# Patient Record
Sex: Male | Born: 1935 | Race: Black or African American | Hispanic: No | Marital: Married | Smoking: Current every day smoker
Health system: Southern US, Community
[De-identification: ages and names within clinical notes are randomized; demographics above are authoritative.]

## PROBLEM LIST (undated history)

## (undated) DIAGNOSIS — E785 Hyperlipidemia, unspecified: Secondary | ICD-10-CM

## (undated) DIAGNOSIS — D649 Anemia, unspecified: Secondary | ICD-10-CM

## (undated) HISTORY — PX: NO PAST SURGERIES: SHX2092

## (undated) HISTORY — DX: Hyperlipidemia, unspecified: E78.5

## (undated) HISTORY — DX: Anemia, unspecified: D64.9

---

## 2000-02-13 ENCOUNTER — Inpatient Hospital Stay (HOSPITAL_COMMUNITY): Admission: EM | Admit: 2000-02-13 | Discharge: 2000-02-18 | Payer: Self-pay | Admitting: Emergency Medicine

## 2000-02-13 ENCOUNTER — Encounter: Payer: Self-pay | Admitting: *Deleted

## 2000-02-25 ENCOUNTER — Encounter: Admission: RE | Admit: 2000-02-25 | Discharge: 2000-02-25 | Payer: Self-pay | Admitting: Internal Medicine

## 2000-02-27 ENCOUNTER — Encounter: Admission: RE | Admit: 2000-02-27 | Discharge: 2000-05-27 | Payer: Self-pay | Admitting: *Deleted

## 2000-02-28 ENCOUNTER — Encounter: Admission: RE | Admit: 2000-02-28 | Discharge: 2000-02-28 | Payer: Self-pay | Admitting: Internal Medicine

## 2000-02-28 ENCOUNTER — Ambulatory Visit (HOSPITAL_COMMUNITY): Admission: RE | Admit: 2000-02-28 | Discharge: 2000-02-28 | Payer: Self-pay | Admitting: *Deleted

## 2000-03-03 ENCOUNTER — Encounter: Admission: RE | Admit: 2000-03-03 | Discharge: 2000-03-03 | Payer: Self-pay | Admitting: Internal Medicine

## 2000-03-05 ENCOUNTER — Ambulatory Visit (HOSPITAL_COMMUNITY): Admission: RE | Admit: 2000-03-05 | Discharge: 2000-03-05 | Payer: Self-pay | Admitting: *Deleted

## 2000-03-07 ENCOUNTER — Encounter: Admission: RE | Admit: 2000-03-07 | Discharge: 2000-03-07 | Payer: Self-pay | Admitting: Internal Medicine

## 2000-03-17 ENCOUNTER — Encounter: Admission: RE | Admit: 2000-03-17 | Discharge: 2000-03-17 | Payer: Self-pay | Admitting: Internal Medicine

## 2000-05-26 ENCOUNTER — Encounter: Admission: RE | Admit: 2000-05-26 | Discharge: 2000-05-26 | Payer: Self-pay | Admitting: Internal Medicine

## 2000-08-25 ENCOUNTER — Encounter: Admission: RE | Admit: 2000-08-25 | Discharge: 2000-08-25 | Payer: Self-pay | Admitting: Internal Medicine

## 2000-11-19 ENCOUNTER — Encounter: Admission: RE | Admit: 2000-11-19 | Discharge: 2000-11-19 | Payer: Self-pay | Admitting: Internal Medicine

## 2001-02-09 ENCOUNTER — Encounter: Admission: RE | Admit: 2001-02-09 | Discharge: 2001-02-09 | Payer: Self-pay

## 2001-03-04 ENCOUNTER — Encounter: Admission: RE | Admit: 2001-03-04 | Discharge: 2001-03-04 | Payer: Self-pay | Admitting: Internal Medicine

## 2001-04-10 ENCOUNTER — Encounter: Admission: RE | Admit: 2001-04-10 | Discharge: 2001-04-10 | Payer: Self-pay | Admitting: Internal Medicine

## 2001-06-08 ENCOUNTER — Encounter: Admission: RE | Admit: 2001-06-08 | Discharge: 2001-06-08 | Payer: Self-pay

## 2001-07-29 ENCOUNTER — Encounter (HOSPITAL_BASED_OUTPATIENT_CLINIC_OR_DEPARTMENT_OTHER): Admission: RE | Admit: 2001-07-29 | Discharge: 2001-08-07 | Payer: Self-pay | Admitting: Internal Medicine

## 2001-09-07 ENCOUNTER — Encounter: Admission: RE | Admit: 2001-09-07 | Discharge: 2001-09-07 | Payer: Self-pay | Admitting: Internal Medicine

## 2001-11-02 ENCOUNTER — Encounter (HOSPITAL_BASED_OUTPATIENT_CLINIC_OR_DEPARTMENT_OTHER): Admission: RE | Admit: 2001-11-02 | Discharge: 2001-11-20 | Payer: Self-pay | Admitting: Internal Medicine

## 2001-12-31 ENCOUNTER — Encounter: Admission: RE | Admit: 2001-12-31 | Discharge: 2001-12-31 | Payer: Self-pay | Admitting: Internal Medicine

## 2002-02-11 ENCOUNTER — Encounter (HOSPITAL_BASED_OUTPATIENT_CLINIC_OR_DEPARTMENT_OTHER): Admission: RE | Admit: 2002-02-11 | Discharge: 2002-05-09 | Payer: Self-pay | Admitting: Internal Medicine

## 2002-04-30 ENCOUNTER — Encounter: Admission: RE | Admit: 2002-04-30 | Discharge: 2002-04-30 | Payer: Self-pay | Admitting: Internal Medicine

## 2002-05-17 ENCOUNTER — Encounter (HOSPITAL_BASED_OUTPATIENT_CLINIC_OR_DEPARTMENT_OTHER): Admission: RE | Admit: 2002-05-17 | Discharge: 2002-08-15 | Payer: Self-pay | Admitting: Internal Medicine

## 2002-07-07 ENCOUNTER — Encounter: Admission: RE | Admit: 2002-07-07 | Discharge: 2002-07-07 | Payer: Self-pay | Admitting: Internal Medicine

## 2002-08-24 ENCOUNTER — Encounter (HOSPITAL_BASED_OUTPATIENT_CLINIC_OR_DEPARTMENT_OTHER): Admission: RE | Admit: 2002-08-24 | Discharge: 2002-10-06 | Payer: Self-pay | Admitting: Internal Medicine

## 2002-10-13 ENCOUNTER — Encounter: Admission: RE | Admit: 2002-10-13 | Discharge: 2002-10-13 | Payer: Self-pay | Admitting: Internal Medicine

## 2002-11-23 ENCOUNTER — Encounter: Admission: RE | Admit: 2002-11-23 | Discharge: 2002-11-23 | Payer: Self-pay | Admitting: Internal Medicine

## 2002-12-31 ENCOUNTER — Encounter (HOSPITAL_BASED_OUTPATIENT_CLINIC_OR_DEPARTMENT_OTHER): Admission: RE | Admit: 2002-12-31 | Discharge: 2003-01-18 | Payer: Self-pay | Admitting: Internal Medicine

## 2003-02-07 ENCOUNTER — Encounter: Admission: RE | Admit: 2003-02-07 | Discharge: 2003-02-07 | Payer: Self-pay | Admitting: Internal Medicine

## 2003-02-10 ENCOUNTER — Encounter: Admission: RE | Admit: 2003-02-10 | Discharge: 2003-02-10 | Payer: Self-pay | Admitting: Internal Medicine

## 2003-04-21 ENCOUNTER — Encounter (HOSPITAL_BASED_OUTPATIENT_CLINIC_OR_DEPARTMENT_OTHER): Admission: RE | Admit: 2003-04-21 | Discharge: 2003-05-26 | Payer: Self-pay | Admitting: Internal Medicine

## 2003-05-19 ENCOUNTER — Encounter: Admission: RE | Admit: 2003-05-19 | Discharge: 2003-05-19 | Payer: Self-pay | Admitting: Internal Medicine

## 2003-09-07 ENCOUNTER — Encounter (HOSPITAL_BASED_OUTPATIENT_CLINIC_OR_DEPARTMENT_OTHER): Admission: RE | Admit: 2003-09-07 | Discharge: 2003-09-22 | Payer: Self-pay | Admitting: Internal Medicine

## 2003-09-16 ENCOUNTER — Encounter: Admission: RE | Admit: 2003-09-16 | Discharge: 2003-09-16 | Payer: Self-pay | Admitting: Internal Medicine

## 2003-12-13 ENCOUNTER — Encounter (HOSPITAL_BASED_OUTPATIENT_CLINIC_OR_DEPARTMENT_OTHER): Admission: RE | Admit: 2003-12-13 | Discharge: 2003-12-27 | Payer: Self-pay | Admitting: Internal Medicine

## 2004-02-07 ENCOUNTER — Ambulatory Visit: Payer: Self-pay | Admitting: Internal Medicine

## 2004-03-27 ENCOUNTER — Encounter (HOSPITAL_BASED_OUTPATIENT_CLINIC_OR_DEPARTMENT_OTHER): Admission: RE | Admit: 2004-03-27 | Discharge: 2004-04-24 | Payer: Self-pay | Admitting: Internal Medicine

## 2004-05-09 ENCOUNTER — Ambulatory Visit: Payer: Self-pay | Admitting: Internal Medicine

## 2004-05-30 ENCOUNTER — Ambulatory Visit: Payer: Self-pay | Admitting: Internal Medicine

## 2004-07-18 ENCOUNTER — Ambulatory Visit: Payer: Self-pay | Admitting: Internal Medicine

## 2004-10-25 ENCOUNTER — Ambulatory Visit: Payer: Self-pay | Admitting: Internal Medicine

## 2005-01-15 ENCOUNTER — Ambulatory Visit: Payer: Self-pay | Admitting: Internal Medicine

## 2005-03-14 ENCOUNTER — Ambulatory Visit: Payer: Self-pay | Admitting: Internal Medicine

## 2005-07-24 ENCOUNTER — Ambulatory Visit: Payer: Self-pay | Admitting: Internal Medicine

## 2005-12-30 ENCOUNTER — Ambulatory Visit: Payer: Self-pay | Admitting: Hospitalist

## 2006-01-14 ENCOUNTER — Encounter (INDEPENDENT_AMBULATORY_CARE_PROVIDER_SITE_OTHER): Payer: Self-pay | Admitting: Internal Medicine

## 2006-01-14 ENCOUNTER — Ambulatory Visit: Payer: Self-pay | Admitting: Internal Medicine

## 2006-01-14 LAB — CONVERTED CEMR LAB
Cholesterol: 183 mg/dL (ref 0–200)
HDL: 79 mg/dL (ref >39)
Total CHOL/HDL Ratio: 2.3

## 2006-04-28 ENCOUNTER — Ambulatory Visit: Payer: Self-pay | Admitting: Hospitalist

## 2006-04-28 LAB — CONVERTED CEMR LAB
BUN: 15 mg/dL (ref 6–23)
CO2: 28 meq/L (ref 19–32)
Calcium: 9.6 mg/dL (ref 8.4–10.5)
Glucose, Bld: 85 mg/dL (ref 70–99)
Potassium: 3.5 meq/L (ref 3.5–5.3)

## 2006-04-30 ENCOUNTER — Telehealth: Payer: Self-pay | Admitting: *Deleted

## 2006-05-05 DIAGNOSIS — D509 Iron deficiency anemia, unspecified: Secondary | ICD-10-CM | POA: Insufficient documentation

## 2006-12-10 ENCOUNTER — Ambulatory Visit: Payer: Self-pay | Admitting: Internal Medicine

## 2006-12-10 ENCOUNTER — Encounter (INDEPENDENT_AMBULATORY_CARE_PROVIDER_SITE_OTHER): Payer: Self-pay | Admitting: Internal Medicine

## 2006-12-10 LAB — CONVERTED CEMR LAB: Hgb A1c MFr Bld: 5.1 %

## 2006-12-11 LAB — CONVERTED CEMR LAB
AST: 42 units/L — ABNORMAL HIGH (ref 0–37)
Albumin: 4.6 g/dL (ref 3.5–5.2)
BUN: 11 mg/dL (ref 6–23)
Calcium: 9.4 mg/dL (ref 8.4–10.5)
Chloride: 104 meq/L (ref 96–112)
Cholesterol: 234 mg/dL — ABNORMAL HIGH (ref 0–200)
Creatinine, Ser: 0.9 mg/dL (ref 0.40–1.50)
Glucose, Bld: 118 mg/dL — ABNORMAL HIGH (ref 70–99)
HCT: 38.7 % — ABNORMAL LOW (ref 39.0–52.0)
HDL: 84 mg/dL (ref >39)
Hemoglobin: 13 g/dL (ref 13.0–17.0)
Microalb Creat Ratio: 5.9 mg/g (ref 0.0–30.0)
RBC: 5.33 M/uL (ref 4.22–5.81)
RDW: 17.4 % — ABNORMAL HIGH (ref 11.5–14.0)
Total CHOL/HDL Ratio: 2.8
Triglycerides: 106 mg/dL (ref <150)

## 2006-12-15 ENCOUNTER — Telehealth: Payer: Self-pay | Admitting: *Deleted

## 2006-12-15 DIAGNOSIS — R945 Abnormal results of liver function studies: Secondary | ICD-10-CM

## 2006-12-18 ENCOUNTER — Ambulatory Visit: Payer: Self-pay | Admitting: Internal Medicine

## 2006-12-18 ENCOUNTER — Encounter (INDEPENDENT_AMBULATORY_CARE_PROVIDER_SITE_OTHER): Payer: Self-pay | Admitting: Internal Medicine

## 2006-12-18 LAB — FECAL OCCULT BLOOD, GUAIAC: Fecal Occult Blood: NEGATIVE

## 2007-02-09 ENCOUNTER — Encounter (INDEPENDENT_AMBULATORY_CARE_PROVIDER_SITE_OTHER): Payer: Self-pay | Admitting: *Deleted

## 2007-02-09 ENCOUNTER — Ambulatory Visit: Payer: Self-pay | Admitting: Internal Medicine

## 2007-02-09 LAB — CONVERTED CEMR LAB: Blood Glucose, Fingerstick: 50

## 2007-02-12 ENCOUNTER — Encounter (INDEPENDENT_AMBULATORY_CARE_PROVIDER_SITE_OTHER): Payer: Self-pay | Admitting: Internal Medicine

## 2007-04-22 ENCOUNTER — Telehealth (INDEPENDENT_AMBULATORY_CARE_PROVIDER_SITE_OTHER): Payer: Self-pay | Admitting: Internal Medicine

## 2007-05-01 ENCOUNTER — Encounter (INDEPENDENT_AMBULATORY_CARE_PROVIDER_SITE_OTHER): Payer: Self-pay | Admitting: Internal Medicine

## 2007-06-03 ENCOUNTER — Telehealth: Payer: Self-pay | Admitting: *Deleted

## 2007-06-17 ENCOUNTER — Ambulatory Visit: Payer: Self-pay | Admitting: Internal Medicine

## 2007-06-18 ENCOUNTER — Encounter (INDEPENDENT_AMBULATORY_CARE_PROVIDER_SITE_OTHER): Payer: Self-pay | Admitting: Internal Medicine

## 2007-07-06 ENCOUNTER — Telehealth (INDEPENDENT_AMBULATORY_CARE_PROVIDER_SITE_OTHER): Payer: Self-pay | Admitting: Internal Medicine

## 2007-07-08 ENCOUNTER — Encounter (INDEPENDENT_AMBULATORY_CARE_PROVIDER_SITE_OTHER): Payer: Self-pay | Admitting: Internal Medicine

## 2007-08-28 ENCOUNTER — Encounter (INDEPENDENT_AMBULATORY_CARE_PROVIDER_SITE_OTHER): Payer: Self-pay | Admitting: Internal Medicine

## 2008-01-13 ENCOUNTER — Ambulatory Visit: Payer: Self-pay | Admitting: Infectious Disease

## 2008-02-22 ENCOUNTER — Encounter (INDEPENDENT_AMBULATORY_CARE_PROVIDER_SITE_OTHER): Payer: Self-pay | Admitting: *Deleted

## 2008-05-09 ENCOUNTER — Telehealth: Payer: Self-pay | Admitting: *Deleted

## 2008-05-11 ENCOUNTER — Telehealth (INDEPENDENT_AMBULATORY_CARE_PROVIDER_SITE_OTHER): Payer: Self-pay | Admitting: *Deleted

## 2008-05-12 ENCOUNTER — Ambulatory Visit: Payer: Self-pay | Admitting: Internal Medicine

## 2008-05-12 ENCOUNTER — Encounter: Payer: Self-pay | Admitting: Internal Medicine

## 2008-05-13 ENCOUNTER — Encounter (INDEPENDENT_AMBULATORY_CARE_PROVIDER_SITE_OTHER): Payer: Self-pay | Admitting: *Deleted

## 2008-05-13 LAB — CONVERTED CEMR LAB
Albumin: 4.6 g/dL (ref 3.5–5.2)
Alkaline Phosphatase: 39 units/L (ref 39–117)
BUN: 10 mg/dL (ref 6–23)
Calcium: 10.5 mg/dL (ref 8.4–10.5)
Chloride: 101 meq/L (ref 96–112)
Creatinine, Urine: 29.8 mg/dL
Ferritin: 215 ng/mL (ref 22–322)
Glucose, Bld: 70 mg/dL (ref 70–99)
HDL: 96 mg/dL (ref >39)
Hemoglobin: 14.9 g/dL (ref 13.0–17.0)
LDL Cholesterol: 97 mg/dL (ref 0–99)
Microalb Creat Ratio: 16.8 mg/g (ref 0.0–30.0)
Microalb, Ur: 0.5 mg/dL (ref 0.00–1.89)
Potassium: 3.7 meq/L (ref 3.5–5.3)
RBC: 6.12 M/uL — ABNORMAL HIGH (ref 4.22–5.81)
Sodium: 141 meq/L (ref 135–145)
Total Protein: 8.6 g/dL — ABNORMAL HIGH (ref 6.0–8.3)
Triglycerides: 74 mg/dL (ref <150)
WBC: 6.7 10*3/uL (ref 4.0–10.5)

## 2008-06-25 ENCOUNTER — Encounter (INDEPENDENT_AMBULATORY_CARE_PROVIDER_SITE_OTHER): Payer: Self-pay | Admitting: *Deleted

## 2008-07-04 ENCOUNTER — Encounter (INDEPENDENT_AMBULATORY_CARE_PROVIDER_SITE_OTHER): Payer: Self-pay | Admitting: *Deleted

## 2008-10-26 ENCOUNTER — Telehealth: Payer: Self-pay | Admitting: Internal Medicine

## 2008-12-12 ENCOUNTER — Encounter: Payer: Self-pay | Admitting: Internal Medicine

## 2009-06-25 ENCOUNTER — Ambulatory Visit: Payer: Self-pay | Admitting: Internal Medicine

## 2009-06-25 ENCOUNTER — Inpatient Hospital Stay (HOSPITAL_COMMUNITY): Admission: EM | Admit: 2009-06-25 | Discharge: 2009-06-28 | Payer: Self-pay | Admitting: Emergency Medicine

## 2009-06-25 ENCOUNTER — Ambulatory Visit: Payer: Self-pay | Admitting: Cardiology

## 2009-06-25 ENCOUNTER — Encounter: Payer: Self-pay | Admitting: *Deleted

## 2009-06-25 ENCOUNTER — Encounter: Payer: Self-pay | Admitting: Internal Medicine

## 2009-06-27 ENCOUNTER — Encounter: Payer: Self-pay | Admitting: Internal Medicine

## 2009-06-28 ENCOUNTER — Encounter: Payer: Self-pay | Admitting: Cardiovascular Disease

## 2009-06-28 ENCOUNTER — Encounter: Payer: Self-pay | Admitting: Internal Medicine

## 2009-07-04 ENCOUNTER — Encounter: Payer: Self-pay | Admitting: *Deleted

## 2009-07-05 ENCOUNTER — Encounter: Payer: Self-pay | Admitting: Internal Medicine

## 2009-07-05 ENCOUNTER — Ambulatory Visit: Payer: Self-pay | Admitting: Internal Medicine

## 2009-07-05 LAB — HM DIABETES FOOT EXAM: HM Diabetic Foot Exam: NEGATIVE

## 2009-07-05 LAB — CONVERTED CEMR LAB
Creatinine, Urine: 238.6 mg/dL
Magnesium: 1.8 mg/dL (ref 1.5–2.5)
Microalb, Ur: 1.73 mg/dL (ref 0.00–1.89)

## 2009-07-07 ENCOUNTER — Encounter: Payer: Self-pay | Admitting: Internal Medicine

## 2009-10-30 ENCOUNTER — Encounter: Payer: Self-pay | Admitting: Internal Medicine

## 2010-04-23 ENCOUNTER — Encounter: Payer: Self-pay | Admitting: Internal Medicine

## 2010-05-01 NOTE — Miscellaneous (Signed)
Summary: [facility] CERTIFICATION AND PLAN OF CARE  [facility] CERTIF[facility]AN [facility] CARE   Imported By: Shon Hough 11/08/2009 10:21:54  _____________________________________________________________________  External Attachment:    Type:   Image     Comment:   External Document

## 2010-05-01 NOTE — Miscellaneous (Signed)
Summary: [facility]  [facility]   Imported By: Margie Billet 07/14/2009 14:49:55  _____________________________________________________________________  External Attachment:    Type:   Image     Comment:   External Document

## 2010-05-01 NOTE — Assessment & Plan Note (Signed)
Summary: EST-HFU/CFB   Vital Signs:  Patient profile:   77 year old male Height:      75 inches (190.50 cm) Temp:     98.8 degrees F (37.11 degrees C) oral Pulse rate:   95 / minute BP sitting:   108 / 72  (right arm)  Vitals Entered By: Stanton Kidney Ditzler RN (July 05, 2009 2:50 PM) Is Patient Diabetic? Yes Did you bring your meter with you today? No Pain Assessment Patient in pain? no      Nutritional Status Detail appetite good CBG Result 111  Have you ever been in a relationship where you felt threatened, hurt or afraid?denies   Does patient need assistance? Functional Status Self care Ambulation Impaired:Risk for fall Comments Uses a cane. Friend with pt - lives not far from pt. HFU - pt states doing well.   Primary Care Provider:  Peggye Pitt MD   History of Present Illness: 77 yo male with DM2 well controlled (A1C 5.6), HTN, HLD, and recent hospitalization for seizure secondary to hypoglycemia (admitted with CBG 18), history of DKA, alcohol and tobacco abuse presents to [facility] for regular follow up appointment. He has no concerns at the time and reports feeling at his baseline. He denies any recent sicknesses besides the recent hospitalization, no episodes of chest pain, no fever/chills, no abdominal or urinary concerns. Still smoking and drinking occasionaly but reports he cut down. No recent history of weight gain or loss, no changes in appetite or mood.        Preventive Screening-Counseling & Management  Alcohol-Tobacco     Alcohol type: seldom     Smoking Status: current     Smoking Cessation Counseling: yes     Packs/Day: 1/2 ppd     Year Started: 2-3 CIGARETTES A WEEK  Caffeine-Diet-Exercise     Does Patient Exercise: yes     Type of exercise: WALKING     Times/week: 1-2  Problems Prior to Update: 1)  Preventive Health Care  (ICD-V70.0) 2)  Liver Function Tests, Abnormal  (ICD-794.8) 3)  Health Maintenance Exam  (ICD-V70.0) 4)  Dm,  Uncomplicated, Type II  (ICD-250.00) 5)  Anemia  (ICD-285.9) 6)  Tobacco Abuse  (ICD-305.1) 7)  Hypertension  (ICD-401.9) 8)  Hyperlipidemia  (ICD-272.4)  Medications Prior to Update: 1)  Lotensin Hct 20-25 Mg Tabs (Benazepril-Hydrochlorothiazide) .... Take 1 Tablet By Mouth Once A Day 2)  Zocor 20 Mg  Tabs (Simvastatin) .... Take 1 Tab By Mouth At Bedtime 3)  Metformin Hcl 500 Mg Tabs (Metformin Hcl) .... Take 1 Tablet By Mouth Two Times A Day For A Week. The Increase It To Take 2 Tablet By Mouth Two Times A Day 4)  Vitamin B-1 100 Mg Tabs (Thiamine Hcl) .... Take 1 Tablet By Mouth Once A Day 5)  Folic Acid 1 Mg Tabs (Folic Acid) .... Take 1 Tablet By Mouth Once A Day  Current Medications (verified): 1)  Lotensin Hct 20-25 Mg Tabs (Benazepril-Hydrochlorothiazide) .... Take 1 Tablet By Mouth Once A Day 2)  Zocor 20 Mg  Tabs (Simvastatin) .... Take 1 Tab By Mouth At Bedtime 3)  Metformin Hcl 500 Mg Tabs (Metformin Hcl) .... Take 1 Tablet By Mouth Two Times A Day For A Week. The Increase It To Take 2 Tablet By Mouth Two Times A Day 4)  Vitamin B-1 100 Mg Tabs (Thiamine Hcl) .... Take 1 Tablet By Mouth Once A Day 5)  Folic Acid 1 Mg Tabs (Folic Acid) .Marland KitchenMarland KitchenMarland Kitchen  Take 1 Tablet By Mouth Once A Day  Allergies (verified): No Known Drug Allergies  Past History:  Past Medical History: Last updated: 05/05/2006 Hyperlipidemia- noraml lipids 4/04 Hypertension tobacco abuse Microcytic anemia Diabetes mellitus type II  Risk Factors: Exercise: yes (07/05/2009)  Risk Factors: Smoking Status: current (07/05/2009) Packs/Day: 1/2 ppd (07/05/2009)  Social History: Packs/Day:  1/2 ppd  Review of Systems       per HPI  Physical Exam  General:  Well-developed,well-nourished,in no acute distress; alert,appropriate and cooperative throughout examination Lungs:  Normal respiratory effort, chest expands symmetrically. Lungs are clear to auscultation, no crackles or wheezes. Heart:  Normal rate and  regular rhythm. S1 and S2 normal without gallop, murmur, click, rub or other extra sounds. Abdomen:  Bowel sounds positive,abdomen soft and non-tender without masses, organomegaly or hernias noted.  Diabetes Management Exam:    Foot Exam (with socks and/or shoes not present):       Sensory-Pinprick/Light touch:          Left medial foot (L-4): normal          Left dorsal foot (L-5): normal          Left lateral foot (S-1): normal          Right medial foot (L-4): normal          Right dorsal foot (L-5): normal          Right lateral foot (S-1): normal       Sensory-Monofilament:          Left foot: normal          Right foot: normal       Inspection:          Left foot: normal          Right foot: normal       Nails:          Left foot: too long          Right foot: too long   Impression & Recommendations:  Problem # 1:  DM, UNCOMPLICATED, TYPE II (ICD-250.00) Well controlled, he is taking 500 mg tab twice per day and I think it is reasonable to continue the same regimen given his good sugar control. Will continue tomonitor this and will readjust the regimen as needed. He reports no hypoglycemic episodes. His updated medication list for this problem includes:    Metformin Hcl 500 Mg Tabs Take 1 tablet by mouth two times a day for a week. the increase it to take 2 tablet by mouth two times a day  Orders: T- Capillary Blood Glucose ([id number]) T-Urine Microalbumin w/creat. ratio [phone])  Problem # 2:  TOBACCO ABUSE (ICD-305.1)  Encouraged smoking cessation and discussed different methods for smoking cessation.   Problem # 3:  HYPERTENSION (ICD-401.9) Good control and even slightly on the low side. Will continue to monitor and will readjust the regimen as indicated.  His updated medication list for this problem includes:    Lotensin Hct 20-25 Mg Tabs (Benazepril-hydrochlorothiazide) .Marland Kitchen... Take 1 tablet by mouth once a day  BP today: 108/72 Prior BP: 150/81  (05/12/2008)  Labs Reviewed: K+: 3.7 (05/12/2008) Creat: : 0.92 (05/12/2008)   Chol: 208 (05/12/2008)   HDL: 96 (05/12/2008)   LDL: 97 (05/12/2008)   TG: 74 (05/12/2008)  Problem # 4:  HYPERLIPIDEMIA (ICD-272.4) At goal, will continue the same regimen. His updated medication list for this problem includes:    Zocor 20 Mg Tabs (Simvastatin) .Marland Kitchen... Take 1  tab by mouth at bedtime  Labs Reviewed: SGOT: 22 (05/12/2008)   SGPT: 12 (05/12/2008)   HDL:96 (05/12/2008), 84 (12/10/2006)  LDL:97 (05/12/2008), 129 (16/12/9602)  Chol:208 (05/12/2008), 234 (12/10/2006)  Trig:74 (05/12/2008), 106 (12/10/2006)  Problem # 5:  HYPOMAGNESEMIA (ICD-275.2) Noted during the hospitalization and determined to be most likley secondary to alcohol abuse. Will check levels today and readjust the regimn. Orders: T-Magnesium ([id number])  Complete Medication List: 1)  Lotensin Hct 20-25 Mg Tabs (Benazepril-hydrochlorothiazide) .... Take 1 tablet by mouth once a day 2)  Zocor 20 Mg Tabs (Simvastatin) .... Take 1 tab by mouth at bedtime 3)  Metformin Hcl 500 Mg Tabs (Metformin hcl) .... Take 1 tablet by mouth two times a day 4)  Vitamin B-1 100 Mg Tabs (Thiamine hcl) .... Take 1 tablet by mouth once a day 5)  Folic Acid 1 Mg Tabs (Folic acid) .... Take 1 tablet by mouth once a day  Other Orders: Capillary Blood Glucose/CBG ([id number])  Patient Instructions: 1)  Please schedule a follow-up appointment in 3 months. 2)  Please check your blood pressure regularly, if it is >170 please call clinic at [phone] 3)  Please check your sugar levels regularly and remember to bring the meter with you to the next clinic appointment, if the sugars are > 350 or < 60 please call us at [phone] 4)  Check your feet each night for sore areas, calluses or signs of infection. 5)  It is important that your Diabetic A1c level is checked every 3 months. Process Orders Check Orders Results:     [facility]: ABN not required  for this insurance Order queued for requisitioning for [facility]: July 05, 2009 3:13 PM  Tests Sent for requisitioning (July 05, 2009 3:13 PM):     07/05/2009: [facility] -- T-Magnesium [[id number]] (signed)     07/05/2009: [facility] -- T-Urine Microalbumin w/creat. ratio [[id number]] (signed)   Prevention & Chronic Care Immunizations   Influenza vaccine: Fluvax 3+  (01/13/2008)   Influenza vaccine deferral: Not indicated  (07/05/2009)    Tetanus booster: Not documented   Td booster deferral: Not indicated  (07/05/2009)    Pneumococcal vaccine: Not documented   Pneumococcal vaccine deferral: Not indicated  (07/05/2009)    H. zoster vaccine: Not documented   H. zoster vaccine deferral: Refused  (07/05/2009)  Colorectal Screening   Hemoccult: negative x 3  (12/18/2006)   Hemoccult action/deferral: Not indicated  (07/05/2009)    Colonoscopy: Not documented   Colonoscopy action/deferral: Not indicated  (07/05/2009)  Other Screening   PSA: Not documented   PSA action/deferral: Not indicated  (07/05/2009)   Smoking status: current  (07/05/2009)   Smoking cessation counseling: yes  (07/05/2009)  Diabetes Mellitus   HgbA1C: 5.6  (06/25/2009)    Eye exam: Not documented    Foot exam: yes  (07/05/2009)   High risk foot: Not documented   Foot care education: Not documented    Urine microalbumin/creatinine ratio: 16.8  (05/12/2008)   Urine microalbumin action/deferral: Ordered    Diabetes flowsheet reviewed?: Yes   Progress toward A1C goal: At goal  Lipids   Total Cholesterol: 208  (05/12/2008)   Lipid panel action/deferral: Not indicated   LDL: 97  (05/12/2008)   LDL Direct: Not documented   HDL: 96  (05/12/2008)   Triglycerides: 74  (05/12/2008)    SGOT (AST): 22  (05/12/2008)   BMP action: Not indicated   SGPT (ALT): 12  (05/12/2008)   Alkaline  phosphatase: 39  (05/12/2008)   Total bilirubin: 0.5  (05/12/2008)     Lipid flowsheet reviewed?: Yes   Progress toward LDL goal: At goal  Hypertension   Last Blood Pressure: 108 / 72  (07/05/2009)   Serum creatinine: 0.92  (05/12/2008)   Serum potassium 3.7  (05/12/2008)    Hypertension flowsheet reviewed?: Yes   Progress toward BP goal: At goal  Self-Management Support :    Patient will work on the following items until the next clinic visit to reach self-care goals:     Medications and monitoring: take my medicines every day, check my blood sugar, check my blood pressure, bring all of my medications to every visit, examine my feet every day  (07/05/2009)     Eating: drink diet soda or water instead of juice or soda, eat more vegetables, use fresh or frozen vegetables, eat foods that are low in salt, eat fruit for snacks and desserts, limit or avoid alcohol  (07/05/2009)    Diabetes self-management support: Written self-care plan, Education handout, Resources for patients handout  (07/05/2009)   Diabetes care plan printed   Diabetes education handout printed    Hypertension self-management support: Written self-care plan, Education handout, Resources for patients handout  (07/05/2009)   Hypertension self-care plan printed.   Hypertension education handout printed    Lipid self-management support: Written self-care plan, Education handout, Resources for patients handout  (07/05/2009)   Lipid self-care plan printed.   Lipid education handout printed      Resource handout printed.

## 2010-05-01 NOTE — Medication Information (Signed)
Summary: [facility]/  [facility]/   Imported By: Margie Billet 07/06/2009 09:55:50  _____________________________________________________________________  External Attachment:    Type:   Image     Comment:   External Document

## 2010-05-01 NOTE — Miscellaneous (Signed)
Summary: hospital discharge (hypoglycemia)  Hospital Discharge  Date of admission: 06/25/2009  Date of discharge: 06/28/2009  Brief reason for admission/active problems: seizures due to hypoglycemia  Followup needed: - DM- was on insulin 70/30 prior to admission. Will discharge on just metformin for now and follow outpatient - Mg- hypomagnesimia commonly seen during the hospitalization probably due to alcaholism - alcaholism, tobacco smoking  The medication and problem lists have been updated.  Please see the dictated discharge summary for details.       Patient Instructions: 1)   Follow up appointment with Outpatient clinic on April 6th at 1:50 pm 2)  Tobacco is very bad for your health and your loved ones! You Should stop smoking!. 3)  Stop Smoking Tips: Choose a Quit date. Cut down before the Quit date. decide what you will do as a substitute when you feel the urge to smoke(gum,toothpick,exercise). 4)  It is not healthy  for men to drink more than 2-3 drinks per day or for women to drink more than 1-2 drinks per day.
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
## 2010-05-26 ENCOUNTER — Encounter: Payer: Self-pay | Admitting: Internal Medicine

## 2010-05-27 ENCOUNTER — Encounter: Payer: Self-pay | Admitting: Internal Medicine

## 2010-06-20 LAB — GLUCOSE, CAPILLARY: Glucose-Capillary: 111 mg/dL — ABNORMAL HIGH (ref 70–99)

## 2010-06-22 LAB — BASIC METABOLIC PANEL
BUN: 3 mg/dL — ABNORMAL LOW (ref 6–23)
BUN: 6 mg/dL (ref 6–23)
CO2: 25 mEq/L (ref 19–32)
CO2: 29 mEq/L (ref 19–32)
Calcium: 9.3 mg/dL (ref 8.4–10.5)
Calcium: 9.4 mg/dL (ref 8.4–10.5)
Chloride: 103 mEq/L (ref 96–112)
Chloride: 105 mEq/L (ref 96–112)
Creatinine, Ser: 0.82 mg/dL (ref 0.4–1.5)
Creatinine, Ser: 0.9 mg/dL (ref 0.4–1.5)
Creatinine, Ser: 0.94 mg/dL (ref 0.4–1.5)
GFR calc Af Amer: >60 mL/min (ref >60)
GFR calc Af Amer: >60 mL/min (ref >60)
GFR calc non Af Amer: >60 mL/min (ref >60)
GFR calc non Af Amer: >60 mL/min (ref >60)
GFR calc non Af Amer: >60 mL/min (ref >60)
Glucose, Bld: 105 mg/dL — ABNORMAL HIGH (ref 70–99)
Glucose, Bld: 152 mg/dL — ABNORMAL HIGH (ref 70–99)
Glucose, Bld: 181 mg/dL — ABNORMAL HIGH (ref 70–99)
Potassium: 3.8 mEq/L (ref 3.5–5.1)
Potassium: 4.1 mEq/L (ref 3.5–5.1)
Sodium: 134 mEq/L — ABNORMAL LOW (ref 135–145)
Sodium: 135 mEq/L (ref 135–145)
Sodium: 136 mEq/L (ref 135–145)

## 2010-06-22 LAB — CBC
HCT: 39.7 % (ref 39.0–52.0)
Hemoglobin: 12.7 g/dL — ABNORMAL LOW (ref 13.0–17.0)
Hemoglobin: 13.4 g/dL (ref 13.0–17.0)
MCHC: 32 g/dL (ref 30.0–36.0)
MCV: 75.7 fL — ABNORMAL LOW (ref 78.0–100.0)
Platelets: 188 10*3/uL (ref 150–400)
Platelets: 214 10*3/uL (ref 150–400)
RBC: 5.23 MIL/uL (ref 4.22–5.81)
RBC: 5.76 MIL/uL (ref 4.22–5.81)
RDW: 16.2 % — ABNORMAL HIGH (ref 11.5–15.5)
RDW: 16.7 % — ABNORMAL HIGH (ref 11.5–15.5)
WBC: 11.7 10*3/uL — ABNORMAL HIGH (ref 4.0–10.5)

## 2010-06-22 LAB — CARDIAC PANEL(CRET KIN+CKTOT+MB+TROPI)
CK, MB: 1.9 ng/mL (ref 0.3–4.0)
CK, MB: 3.6 ng/mL (ref 0.3–4.0)
Total CK: 1838 U/L — ABNORMAL HIGH (ref 7–232)
Troponin I: 0.06 ng/mL (ref 0.00–0.06)

## 2010-06-22 LAB — T4, FREE: Free T4: 1.06 ng/dL (ref 0.80–1.80)

## 2010-06-22 LAB — HEMOGLOBIN A1C
Hgb A1c MFr Bld: 5.6 % (ref 4.6–6.1)
Mean Plasma Glucose: 114 mg/dL

## 2010-06-22 LAB — MAGNESIUM: Magnesium: 1.6 mg/dL (ref 1.5–2.5)

## 2010-06-22 LAB — DRUGS OF ABUSE SCREEN W/O ALC, ROUTINE URINE
Barbiturate Quant, Ur: NEGATIVE
Cocaine Metabolites: NEGATIVE
Creatinine,U: 67.1 mg/dL
Methadone: NEGATIVE
Opiate Screen, Urine: NEGATIVE
Phencyclidine (PCP): NEGATIVE

## 2010-06-22 LAB — URINALYSIS, ROUTINE W REFLEX MICROSCOPIC
Nitrite: NEGATIVE
Specific Gravity, Urine: 1.013 (ref 1.005–1.030)
Urobilinogen, UA: 1 mg/dL (ref 0.0–1.0)
pH: 6 (ref 5.0–8.0)

## 2010-06-22 LAB — GLUCOSE, CAPILLARY
Glucose-Capillary: 120 mg/dL — ABNORMAL HIGH (ref 70–99)
Glucose-Capillary: 136 mg/dL — ABNORMAL HIGH (ref 70–99)
Glucose-Capillary: 148 mg/dL — ABNORMAL HIGH (ref 70–99)
Glucose-Capillary: 152 mg/dL — ABNORMAL HIGH (ref 70–99)
Glucose-Capillary: 154 mg/dL — ABNORMAL HIGH (ref 70–99)
Glucose-Capillary: 158 mg/dL — ABNORMAL HIGH (ref 70–99)
Glucose-Capillary: 174 mg/dL — ABNORMAL HIGH (ref 70–99)
Glucose-Capillary: 186 mg/dL — ABNORMAL HIGH (ref 70–99)
Glucose-Capillary: 190 mg/dL — ABNORMAL HIGH (ref 70–99)
Glucose-Capillary: 43 mg/dL — CL (ref 70–99)

## 2010-06-22 LAB — ETHANOL: Alcohol, Ethyl (B): <5 mg/dL (ref 0–10)

## 2010-06-22 LAB — COMPREHENSIVE METABOLIC PANEL
ALT: 26 U/L (ref 0–53)
AST: 41 U/L — ABNORMAL HIGH (ref 0–37)
Albumin: 4 g/dL (ref 3.5–5.2)
Chloride: 98 mEq/L (ref 96–112)
Creatinine, Ser: 1.03 mg/dL (ref 0.4–1.5)
GFR calc Af Amer: >60 mL/min (ref >60)
Potassium: 2.7 mEq/L — CL (ref 3.5–5.1)
Sodium: 137 mEq/L (ref 135–145)
Total Bilirubin: 0.8 mg/dL (ref 0.3–1.2)

## 2010-06-22 LAB — RETICULOCYTES
RBC.: 5.77 MIL/uL (ref 4.22–5.81)
Retic Ct Pct: 0.5 % (ref 0.4–3.1)

## 2010-06-22 LAB — DIFFERENTIAL
Basophils Absolute: 0 10*3/uL (ref 0.0–0.1)
Eosinophils Absolute: 0 10*3/uL (ref 0.0–0.7)
Eosinophils Relative: 0 % (ref 0–5)
Lymphocytes Relative: 8 % — ABNORMAL LOW (ref 12–46)
Monocytes Absolute: 0.6 10*3/uL (ref 0.1–1.0)

## 2010-06-22 LAB — FERRITIN: Ferritin: 316 ng/mL (ref 22–322)

## 2010-06-22 LAB — POCT CARDIAC MARKERS
CKMB, poc: 1.1 ng/mL (ref 1.0–8.0)
Troponin i, poc: <0.05 ng/mL (ref 0.00–0.09)

## 2010-06-22 LAB — APTT: aPTT: 25 seconds (ref 24–37)

## 2010-06-22 LAB — PROTIME-INR
INR: 0.98 (ref 0.00–1.49)
Prothrombin Time: 12.9 seconds (ref 11.6–15.2)

## 2010-06-22 LAB — IRON AND TIBC: UIBC: 188 ug/dL

## 2010-07-17 LAB — GLUCOSE, CAPILLARY: Glucose-Capillary: 77 mg/dL (ref 70–99)

## 2010-08-17 NOTE — Discharge Summary (Signed)
[place]  Patient:    Shaun Pena, Shaun Pena                       MRN: [id number] Adm. Date:  [id number] Disc. Date: [id number] Attending:  Farley Ly Dictator:   Tawni Millers                           Discharge Summary  DISCHARGE DIAGNOSES: 1. Diabetic ketoacidosis, new-onset diabetes.  Hemoglobin A1C on admission was    15.4.  Currently on 70/30 25 units b.i.d. 2. Anemia.  Patient noted to have microcytic anemia.  Iron studies were    gathered, showing iron elevated at 176, total iron binding capacity    elevated at 190, ferritin increased at 892, and percent saturations at 93%.    Sickle cell screen was negative.  At discharge, hemoglobin electrophoresis    was pending. 3. Alcohol abuse.  The patient was placed on thiamine, multiple vitamin, and    folate while hospitalized.  At discharge, he was to continue on    multivitamin one time a day. 4. Tobacco abuse.  The patient did not require a patch during this    hospitalization. 5. History of arthritis.  This was not an issue during this hospitalization.  DISCHARGE MEDICATIONS: 1. Insulin 70/30 25 units b.i.d. 2. Multivitamin 1 tablet p.o. q.d.  DISCHARGE INSTRUCTIONS:  Follow-up C-peptide and hemoglobin electrophoresis.  CONSULTATIONS:  None.  PROCEDURES: 1. EKG on February 13, 2000, showing a normal sinus rhythm with sinus    arrhythmia and biventricular hypertrophy. 2. Chest x-ray on February 13, 2000, showing no acute disease.  Lungs are    hyperinflated suggesting COPD.  HISTORY OF PRESENT ILLNESS:  The patient is a 77 year old male who presented to the ED via EMS complaining of weakness x 3 days and a high blood sugar. The patients family reports that he was lying in bed for the last 1-1/2 days. The patient also reports a history of a 20-pound weight loss since June with, notably, nine in the last week.  History also notable for polyuria, nocturia every one hour, and a decrease  in appetite.  The patient went to M.D. yesterday for congestion and was prescribed Augmentin in the form of guaifenesin.  The patient is otherwise healthy without any past medical history.  Of note, the patients family checked blood sugar this morning, but the blood sugar was too high for reading.  In ED the patient was found to have a blood sugar of 707 and a bicarbonate of 11.  He was started on 10 units insulin drip and given 2 L bolus of IV fluids, with subsequent rate of 250 cc/hr.  Currently, blood sugar is 403.  PAST MEDICAL HISTORY: 1. Arthritis. 2. The patient had a tooth pulled one month ago.  PAST SURGICAL HISTORY:  Denies any hospitalizations or blood transfusions.  MEDICATIONS:  Augmentin and guaifenesin that the patient has been taking since February 12, 2000.  ALLERGIES:  No known drug allergies.  SOCIAL HISTORY:  The patient lives in [place] with wife, brother-in-law, and grandson.  He worked as a Estate agent.  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  He has seven children, all who are healthy.  History of a 50-pack-year use of cigarettes.  The patient reports heavy use of alcohol with about a fifth of a pint per day.  However, he reports quitting about three  weeks ago.  He denies any IV drug use.  FAMILY HISTORY:  Father died of diabetes mellitus, and mom died of a stroke. He has several brothers with diabetes mellitus.  REVIEW OF SYSTEMS:  Positive for weight loss and decrease in appetite.  The patient denies any fevers or chills.  SKIN:  The patient denies any rashes or bruises.  HEENT:  Positive for fatigue, as noted in the HPI.  CARDIOVASCULAR: The patient denies any chest pain, PND, or lower extremity edema. RESPIRATORY:  The patient denies any cough, dyspnea on exertion, but reports some chest congestion over the last two days.  GASTROINTESTINAL:  The patient denies any constipation, diarrhea, nausea, vomiting, or blood per  rectum. GENITOURINARY:  The patient denies any dysuria.  See HPI.  NEUROLOGIC:  The patient denies any numbness or weakness in the lower extremities. PSYCHIATRIC:  The patient denies any depression or anxiety.  ENDOCRINE:  The patient reports polyuria, nocturia, and polydipsia.  PHYSICAL EXAMINATION:  VITAL SIGNS:  Temperature 98, pulse 120, respirations 24, blood pressure 156/87, O2 saturations 98% on room air.  GENERAL:  The patient appears older than stated age.  Disheveled.  No apparent distress.  Pleasant.  Thin.  HEENT:  Normocephalic, atraumatic.  Pupils are equal, round, and reactive to light.  Extraocular muscles intact.  Conjunctivae without pallor.  Significant for a sty on the left eye.  TMs clear with visible landmarks.  Oral mucous membranes are dry.  Oropharynx without erythema or exudate.  NECK:  Supple.  No lymphadenopathy or JVD.  CARDIOVASCULAR:  Tachycardic.  Regular rhythm.  Capillary refill less than three seconds.  Pulses 2+ bilaterally.  No murmurs, rubs, or gallops appreciated.  LUNGS:  Clear to auscultation bilaterally.  ABDOMEN:  Positive bowel sounds.  Soft, nontender, nondistended.  GU:  Normal external genitalia.  RECTAL:  Negative for Hemoccult.  EXTREMITIES:  Negative for any edema or calf tenderness.  However, large nodule on medial aspect of lower left extremity is present.  There does not appear to be any increased warmth or erythema associated with this nodule.  NEUROLOGIC:  Alert and oriented x 4.  Answering questions appropriately. Cranial nerves II-XII intact.  Motor 5/5 bilaterally on upper and lower extremities.  Sensation grossly intact without any deficits.  Cerebellar, finger-to-nose without past pointing and is goal directed.  Babinski downgoing.  SKIN:  Positive for dryness and warmth.  LABORATORY DATA:  Sodium 133, potassium 4.7, chloride 99, bicarbonate 11, BUN 50, creatinine 2.1, glucose 707, and gap of 23.  ABG drawn  shows pH of 7.169, pCO2 of 16.7, O2 of 130, bicarbonate of 6, and O2 saturation of 98%.  The  patients alcohol screen was less than 10.  Cardiac enzymes were negative x 1. UA positive for greater than 1000 glucose and greater than 80 ketones.  Chest x-ray was significant for no active disease.  EKG showed normal sinus rhythm with sinus arrhythmia and biventricular hypertrophy.  HOSPITAL COURSE: #1 - DIABETIC KETOACIDOSIS, [place] DIABETES:  On admission, the patient was thought to be in DKA.  He was maintained on insulin drip.  In order to monitor correction of acidosis, serial blood sugars and electrolytes were ordered. This treatment regimen was maintained until the bicarbonate and anion gap normalized.  After correction of metabolic acidosis, the patient was fed and given 12 units of NPH.  Subsequently, the insulin drip was discontinued one hour after the NPH was given.  In addition, electrolyte and glucose checks were decreased in frequency.  The patient was initially started on Ultralente insulin plus a sliding scale.  The patient also received intensive diabetic education during this hospitalization.  Because of inadequate control on Ultralente and sliding scale insulin with blood sugars in the 300s, the patient was started on fixed dose of 70/30 b.i.d.  This was adjusted for better blood sugar control.  Prior to discharge, the patient was on insulin 70/30 25 units b.i.d., with blood sugars slightly better managed on this regimen.  Because of difficulty drawing insulin, he was given prescription for 70/30 insulin pens to assist with insulin dosing.  He received extensive instructions on glucose checks and insulin injections.  At discharge it was decided that home health would visit the patient for several days to assist in his monitoring of his blood sugar and insulin dosing.  He is to follow up in the internal medicine clinic on Monday, February 25, 2000.  This plan  was discussed with the patient, and he agreed.  DISCHARGE LABORATORY DATA:  Sodium 139, potassium 3.9, chloride 104, bicarbonate 29, BUN 11, creatinine 0.7, and glucose 318.  CBC showed white blood cell count of 5.3, hemoglobin of 11.1, hematocrit of 33.3, platelets of 132, MCV of 68.4, and an RDW of 13.6.  Blood cultures at discharge were negative x 5 days.  At discharge, hemoglobin electrophoresis and C-peptide were pending.  DISCHARGE INSTRUCTIONS:  The patient is to take medicines as directed.  He is to institute a 2000-calorie ADA diet.  He was encouraged to stop drinking alcohol and smoking.  The patient is to follow up in the internal medicine clinic with Dr. Verdis Frederickson on February 25, 2000, at 2:30 p.m.  He is to also follow up with the nutrition and diabetes management clinic on February 26, 2000, at 5:30 p.m.  The patient was instructed to record his daily blood sugars from breakfast, lunch, dinner, and bedtime.  He will need to be scheduled with an ophthalmologist at his follow-up clinic appointment. DD:  02/19/00 TD:  02/21/00 Job: [id number]

## 2013-03-09 ENCOUNTER — Emergency Department (HOSPITAL_COMMUNITY): Payer: Medicare Other

## 2013-03-09 ENCOUNTER — Encounter (HOSPITAL_COMMUNITY): Payer: Self-pay | Admitting: Emergency Medicine

## 2013-03-09 ENCOUNTER — Emergency Department (HOSPITAL_COMMUNITY)
Admission: EM | Admit: 2013-03-09 | Discharge: 2013-03-09 | Disposition: A | Discharge status: Home / Self Care | Payer: Medicare Other | Attending: Emergency Medicine | Admitting: Emergency Medicine

## 2013-03-09 DIAGNOSIS — I10 Essential (primary) hypertension: Secondary | ICD-10-CM | POA: Insufficient documentation

## 2013-03-09 DIAGNOSIS — W19XXXA Unspecified fall, initial encounter: Secondary | ICD-10-CM

## 2013-03-09 DIAGNOSIS — F17200 Nicotine dependence, unspecified, uncomplicated: Secondary | ICD-10-CM | POA: Insufficient documentation

## 2013-03-09 DIAGNOSIS — S42202A Unspecified fracture of upper end of left humerus, initial encounter for closed fracture: Secondary | ICD-10-CM

## 2013-03-09 DIAGNOSIS — S42213A Unspecified displaced fracture of surgical neck of unspecified humerus, initial encounter for closed fracture: Secondary | ICD-10-CM | POA: Insufficient documentation

## 2013-03-09 DIAGNOSIS — Z79899 Other long term (current) drug therapy: Secondary | ICD-10-CM | POA: Insufficient documentation

## 2013-03-09 DIAGNOSIS — Z862 Personal history of diseases of the blood and blood-forming organs and certain disorders involving the immune mechanism: Secondary | ICD-10-CM | POA: Insufficient documentation

## 2013-03-09 DIAGNOSIS — E785 Hyperlipidemia, unspecified: Secondary | ICD-10-CM | POA: Insufficient documentation

## 2013-03-09 DIAGNOSIS — Y9389 Activity, other specified: Secondary | ICD-10-CM | POA: Insufficient documentation

## 2013-03-09 DIAGNOSIS — S0990XA Unspecified injury of head, initial encounter: Secondary | ICD-10-CM | POA: Insufficient documentation

## 2013-03-09 DIAGNOSIS — Y92009 Unspecified place in unspecified non-institutional (private) residence as the place of occurrence of the external cause: Secondary | ICD-10-CM | POA: Insufficient documentation

## 2013-03-09 DIAGNOSIS — E119 Type 2 diabetes mellitus without complications: Secondary | ICD-10-CM | POA: Insufficient documentation

## 2013-03-09 DIAGNOSIS — W1809XA Striking against other object with subsequent fall, initial encounter: Secondary | ICD-10-CM | POA: Insufficient documentation

## 2013-03-09 LAB — BASIC METABOLIC PANEL
BUN: 19 mg/dL (ref 6–23)
CO2: 27 mEq/L (ref 19–32)
Calcium: 9.8 mg/dL (ref 8.4–10.5)
Chloride: 98 mEq/L (ref 96–112)
Creatinine, Ser: 0.82 mg/dL (ref 0.50–1.35)
Glucose, Bld: 128 mg/dL — ABNORMAL HIGH (ref 70–99)

## 2013-03-09 LAB — CBC WITH DIFFERENTIAL/PLATELET
Basophils Relative: 0 % (ref 0–1)
Eosinophils Absolute: 0 10*3/uL (ref 0.0–0.7)
Eosinophils Relative: 0 % (ref 0–5)
HCT: 35.3 % — ABNORMAL LOW (ref 39.0–52.0)
Hemoglobin: 12.1 g/dL — ABNORMAL LOW (ref 13.0–17.0)
Lymphs Abs: 1.7 10*3/uL (ref 0.7–4.0)
MCH: 23.4 pg — ABNORMAL LOW (ref 26.0–34.0)
MCHC: 34.3 g/dL (ref 30.0–36.0)
MCV: 68.3 fL — ABNORMAL LOW (ref 78.0–100.0)
Monocytes Absolute: 0.8 10*3/uL (ref 0.1–1.0)
Neutro Abs: 8.3 10*3/uL — ABNORMAL HIGH (ref 1.7–7.7)
Neutrophils Relative %: 77 % (ref 43–77)
RBC: 5.17 MIL/uL (ref 4.22–5.81)

## 2013-03-09 LAB — URINE MICROSCOPIC-ADD ON

## 2013-03-09 LAB — URINALYSIS, ROUTINE W REFLEX MICROSCOPIC
Glucose, UA: NEGATIVE mg/dL
Hgb urine dipstick: NEGATIVE
Ketones, ur: 15 mg/dL — AB
Leukocytes, UA: NEGATIVE
Protein, ur: 30 mg/dL — AB
pH: 5.5 (ref 5.0–8.0)

## 2013-03-09 MED ORDER — HYDROCODONE-ACETAMINOPHEN 5-325 MG PO TABS: 1.0000 | ORAL_TABLET | Freq: Four times a day (QID) | ORAL | Status: DC | PRN

## 2013-03-09 NOTE — ED Notes (Signed)
Patient transported to X-ray 

## 2013-03-09 NOTE — ED Notes (Signed)
Per EMS, pt. Slid off of his bed at home. Pt. Also had fall x 1 week and injured L shoulder. Pt. Lacks ROM in that arm. Pt. Has no new injuries upon this fall. A&Ox4. NAD noted.

## 2013-03-09 NOTE — ED Notes (Signed)
Pt. Daughter, Javier Gell, notified of Pt. Diagnosis, treatment, and discharge. Daughter is unable to pick up patient at this time. Charge RN made aware. Pt. Will stay in room until daughter can pick him up around 0630.

## 2013-03-09 NOTE — ED Provider Notes (Signed)
TIME SEEN: 12:13 AM  CHIEF COMPLAINT: Fall  HPI: Patient is a 79 year old right-hand-dominant male with a history of hypertension, hyperlipidemia, diabetes who presents the emergency department after a fall today. Patient reports that he was changing clothes and sitting on the edge of his bed when he slid off the bed and struck his head on the floor. He denies loss of consciousness. He also reports that he had a fall approximately one week ago injuring his left arm but was not seen any doctor for this. He states he is not sure why he fell during that episode. Denies any current chest pain, shortness of breath, dizziness, numbness, tingling or focal weakness, headache. He is not on any anticoagulation. Denies any recent fever, cough, nausea, vomiting or diarrhea.  ROS: See HPI Constitutional: no fever  Eyes: no drainage  ENT: no runny nose   Cardiovascular:  no chest pain  Resp: no SOB  GI: no vomiting GU: no dysuria Integumentary: no rash  Allergy: no hives  Musculoskeletal: no leg swelling  Neurological: no slurred speech ROS otherwise negative  PAST MEDICAL HISTORY/PAST SURGICAL HISTORY:  Past Medical History  Diagnosis Date  . Hyperlipidemia   . Hypertension   . Diabetes mellitus   . Anemia     microcytic with MCV = 72 and Hg/Hct WNL    MEDICATIONS:  Prior to Admission medications   Medication Sig Start Date End Date Taking? Authorizing Provider  benazepril-hydrochlorthiazide (LOTENSIN HCT) 20-25 MG per tablet Take 1 tablet by mouth daily.      Historical Provider, MD  folic acid (FOLVITE) 1 MG tablet Take 1 mg by mouth daily.      Historical Provider, MD  metFORMIN (GLUCOPHAGE) 500 MG tablet Take 500 mg by mouth 2 (two) times daily with a meal.      Historical Provider, MD  simvastatin (ZOCOR) 20 MG tablet Take 20 mg by mouth at bedtime.      Historical Provider, MD  thiamine 100 MG tablet Take 100 mg by mouth daily.      Historical Provider, MD    ALLERGIES:  No Known  Allergies  SOCIAL HISTORY:  History  Substance Use Topics  . Smoking status: Current Every Day Smoker  . Smokeless tobacco: Not on file  . Alcohol Use: No    FAMILY HISTORY: Family History  Problem Relation Age of Onset  . Stroke Neg Hx   . Cancer Neg Hx     EXAM: BP 158/92  Pulse 98  Temp(Src) 98.1 F (36.7 C) (Oral)  Resp 20  SpO2 100% CONSTITUTIONAL: Alert and oriented and responds appropriately to questions. Well-appearing; well-nourished; GCS 15 HEAD: Normocephalic; atraumatic EYES: Conjunctivae clear, PERRL, EOMI ENT: normal nose; no rhinorrhea; moist mucous membranes; pharynx without lesions noted; no dental injury; no hemotypanum; no septal hematoma NECK: Supple, no meningismus, no LAD; no midline spinal tenderness, step-off or deformity CARD: RRR; S1 and S2 appreciated; no murmurs, no clicks, no rubs, no gallops RESP: Normal chest excursion without splinting or tachypnea; breath sounds clear and equal bilaterally; no wheezes, no rhonchi, no rales; chest wall stable, nontender to palpation ABD/GI: Normal bowel sounds; non-distended; soft, non-tender, no rebound, no guarding PELVIS:  stable, nontender to palpation BACK:  The back appears normal and is non-tender to palpation, there is no CVA tenderness; no midline spinal tenderness, step-off or deformity EXT: Patient has tenderness to palpation over the proximal humerus with significant amount of ecchymosis and swelling, compartments are soft, normal sensation throughout his left arm  with normal grip strength and normal range of motion in his left elbow, left wrist, fingers, 2+ radial pulses bilaterally, otherwise Normal ROM in all joints; non-tender to palpation; no edema; normal capillary refill; no cyanosis    SKIN: Normal color for age and race; warm NEURO: Cranial nerves II through XII intact, sensation to light touch intact diffusely, patient has full range of motion in his right arm and bilateral lower extremities  but is unable to move his left shoulder secondary to his injury but has normal grip strength in the left hand PSYCH: The patient's mood and manner are appropriate. Grooming and personal hygiene are appropriate.  MEDICAL DECISION MAKING: Patient here with 2 falls in the past week. He is unable to recall any events from the prior fall he was not seen by a physician for this. I suspect that he likely suffered a proximal humerus fracture. He also had a fall this evening where he slid out of bed and struck his head. Given his age and distracting injury, will obtain CT head and cervical spine. Given he is unable to recall the events of his previous fall, will obtain labs, urine. Patient denies wanting any pain medication at this time.  EKG shows no new ischemic changes.  ED PROGRESS: CT head and cervical spine show no acute injury. Patient has a comminuted, displaced surgical neck fracture of the left humerus. Will place in sling and give orthopedic followup. Given his age and multiple comorbidities, patient is likely not a good surgical candidate. He is neurovascularly intact distally.  Labs are unremarkable other than a leukocytosis which is likely reactive. Troponin is negative. Urine pending.  Lesions urine shows no sign of infection. I have discussed with patient that he needs to followup with orthopedics this week. I do not feel an urgent consultation is required given his injury has been present for over one week. We'll place in sling. Nursing staff will also discuss with patient's daughters and update them on plan and that he will need to followup with orthopedics. Given return precautions. Patient verbalizes understanding is comfortable with plan. Will also discharge home with prescription for Vicodin.   EKG Interpretation    Date/Time:  Tuesday March 09 2013 00:28:31 EST Ventricular Rate:  97 PR Interval:  158 QRS Duration: 102 QT Interval:  378 QTC Calculation: 480 R Axis:   27 Text  Interpretation:  Sinus rhythm Atrial premature complexes Abnormal R-wave progression, early transition Borderline prolonged QT interval Confirmed by Rihaan Barrack  DO, Sefora Tietje ([id number]) on 03/09/2013 12:34:46 AM             Layla Maw Chelsie Burel, DO 03/09/13 [id number]

## 2013-03-09 NOTE — ED Notes (Addendum)
Obtained permission from pt. To speak with son, daughter-in-law and daughters about care. Family left phone number to reach them with updates: [phone]. Also, Christophere Hillhouse, daughter's number is [phone].

## 2013-03-22 ENCOUNTER — Emergency Department (HOSPITAL_COMMUNITY): Payer: Medicare Other

## 2013-03-22 ENCOUNTER — Encounter (HOSPITAL_COMMUNITY): Payer: Self-pay | Admitting: Internal Medicine

## 2013-03-22 ENCOUNTER — Inpatient Hospital Stay (HOSPITAL_COMMUNITY)
Admission: EM | Admit: 2013-03-22 | Discharge: 2013-03-24 | DRG: 193 | Disposition: A | Discharge status: Skilled Nursing Facility | Payer: Medicare Other | Attending: Internal Medicine | Admitting: Internal Medicine

## 2013-03-22 DIAGNOSIS — L8992 Pressure ulcer of unspecified site, stage 2: Secondary | ICD-10-CM | POA: Diagnosis present

## 2013-03-22 DIAGNOSIS — D509 Iron deficiency anemia, unspecified: Secondary | ICD-10-CM | POA: Diagnosis present

## 2013-03-22 DIAGNOSIS — E43 Unspecified severe protein-calorie malnutrition: Secondary | ICD-10-CM

## 2013-03-22 DIAGNOSIS — E785 Hyperlipidemia, unspecified: Secondary | ICD-10-CM | POA: Diagnosis present

## 2013-03-22 DIAGNOSIS — Z7401 Bed confinement status: Secondary | ICD-10-CM

## 2013-03-22 DIAGNOSIS — R627 Adult failure to thrive: Secondary | ICD-10-CM | POA: Diagnosis present

## 2013-03-22 DIAGNOSIS — S42202S Unspecified fracture of upper end of left humerus, sequela: Secondary | ICD-10-CM

## 2013-03-22 DIAGNOSIS — I10 Essential (primary) hypertension: Secondary | ICD-10-CM

## 2013-03-22 DIAGNOSIS — R5381 Other malaise: Secondary | ICD-10-CM | POA: Diagnosis present

## 2013-03-22 DIAGNOSIS — D649 Anemia, unspecified: Secondary | ICD-10-CM

## 2013-03-22 DIAGNOSIS — J189 Pneumonia, unspecified organism: Principal | ICD-10-CM

## 2013-03-22 DIAGNOSIS — R531 Weakness: Secondary | ICD-10-CM

## 2013-03-22 DIAGNOSIS — R7303 Prediabetes: Secondary | ICD-10-CM

## 2013-03-22 DIAGNOSIS — Z681 Body mass index (BMI) 19 or less, adult: Secondary | ICD-10-CM

## 2013-03-22 DIAGNOSIS — Y92009 Unspecified place in unspecified non-institutional (private) residence as the place of occurrence of the external cause: Secondary | ICD-10-CM

## 2013-03-22 DIAGNOSIS — E119 Type 2 diabetes mellitus without complications: Secondary | ICD-10-CM | POA: Diagnosis present

## 2013-03-22 DIAGNOSIS — F17200 Nicotine dependence, unspecified, uncomplicated: Secondary | ICD-10-CM | POA: Diagnosis present

## 2013-03-22 DIAGNOSIS — W19XXXA Unspecified fall, initial encounter: Secondary | ICD-10-CM | POA: Diagnosis present

## 2013-03-22 DIAGNOSIS — F1020 Alcohol dependence, uncomplicated: Secondary | ICD-10-CM

## 2013-03-22 DIAGNOSIS — L89209 Pressure ulcer of unspecified hip, unspecified stage: Secondary | ICD-10-CM | POA: Diagnosis present

## 2013-03-22 DIAGNOSIS — S42209A Unspecified fracture of upper end of unspecified humerus, initial encounter for closed fracture: Secondary | ICD-10-CM | POA: Diagnosis present

## 2013-03-22 DIAGNOSIS — R269 Unspecified abnormalities of gait and mobility: Secondary | ICD-10-CM

## 2013-03-22 LAB — CBC WITH DIFFERENTIAL/PLATELET
Basophils Absolute: 0 10*3/uL (ref 0.0–0.1)
Eosinophils Absolute: 0.1 10*3/uL (ref 0.0–0.7)
HCT: 36.6 % — ABNORMAL LOW (ref 39.0–52.0)
Hemoglobin: 12.8 g/dL — ABNORMAL LOW (ref 13.0–17.0)
Lymphs Abs: 2.6 10*3/uL (ref 0.7–4.0)
MCH: 24 pg — ABNORMAL LOW (ref 26.0–34.0)
MCHC: 35 g/dL (ref 30.0–36.0)
MCV: 68.7 fL — ABNORMAL LOW (ref 78.0–100.0)
Monocytes Absolute: 0.9 10*3/uL (ref 0.1–1.0)
Neutro Abs: 6.5 10*3/uL (ref 1.7–7.7)
RDW: 15 % (ref 11.5–15.5)
WBC: 10.1 10*3/uL (ref 4.0–10.5)

## 2013-03-22 LAB — COMPREHENSIVE METABOLIC PANEL
Albumin: 3.6 g/dL (ref 3.5–5.2)
Alkaline Phosphatase: 51 U/L (ref 39–117)
BUN: 11 mg/dL (ref 6–23)
Chloride: 93 mEq/L — ABNORMAL LOW (ref 96–112)
Creatinine, Ser: 0.78 mg/dL (ref 0.50–1.35)
GFR calc Af Amer: >90 mL/min (ref >90)
Total Bilirubin: 0.7 mg/dL (ref 0.3–1.2)
Total Protein: 7.8 g/dL (ref 6.0–8.3)

## 2013-03-22 LAB — PROTIME-INR: Prothrombin Time: 13.7 seconds (ref 11.6–15.2)

## 2013-03-22 LAB — TROPONIN I: Troponin I: <0.3 ng/mL (ref <0.30)

## 2013-03-22 MED ORDER — ACETAMINOPHEN 650 MG RE SUPP: 650.0000 mg | Freq: Four times a day (QID) | RECTAL | Status: DC | PRN

## 2013-03-22 MED ORDER — LORAZEPAM 1 MG PO TABS: 1.0000 mg | ORAL_TABLET | Freq: Four times a day (QID) | ORAL | Status: DC | PRN

## 2013-03-22 MED ORDER — ENOXAPARIN SODIUM 40 MG/0.4ML ~~LOC~~ SOLN
40.0000 mg | SUBCUTANEOUS | Status: DC
  Administered 2013-03-23 – 2013-03-24 (×2): 40 mg via SUBCUTANEOUS
  Filled 2013-03-22 (×2): qty 0.4

## 2013-03-22 MED ORDER — ONDANSETRON HCL 4 MG/2ML IJ SOLN: 4.0000 mg | Freq: Four times a day (QID) | INTRAMUSCULAR | Status: DC | PRN

## 2013-03-22 MED ORDER — SODIUM CHLORIDE 0.9 % IV SOLN
INTRAVENOUS | Status: AC
  Administered 2013-03-23 (×2): via INTRAVENOUS

## 2013-03-22 MED ORDER — FOLIC ACID 1 MG PO TABS
1.0000 mg | ORAL_TABLET | Freq: Every day | ORAL | Status: DC
  Administered 2013-03-23 – 2013-03-24 (×2): 1 mg via ORAL
  Filled 2013-03-22 (×2): qty 1

## 2013-03-22 MED ORDER — ADULT MULTIVITAMIN W/MINERALS CH
1.0000 | ORAL_TABLET | Freq: Every day | ORAL | Status: DC
  Administered 2013-03-23 – 2013-03-24 (×2): 1 via ORAL
  Filled 2013-03-22 (×2): qty 1

## 2013-03-22 MED ORDER — VITAMIN B-1 100 MG PO TABS
100.0000 mg | ORAL_TABLET | Freq: Every day | ORAL | Status: DC
  Administered 2013-03-23 – 2013-03-24 (×2): 100 mg via ORAL
  Filled 2013-03-22 (×2): qty 1

## 2013-03-22 MED ORDER — ACETAMINOPHEN 325 MG PO TABS
650.0000 mg | ORAL_TABLET | Freq: Four times a day (QID) | ORAL | Status: DC | PRN
  Administered 2013-03-23 – 2013-03-24 (×4): 650 mg via ORAL
  Filled 2013-03-22 (×4): qty 2

## 2013-03-22 MED ORDER — ONDANSETRON HCL 4 MG PO TABS: 4.0000 mg | ORAL_TABLET | Freq: Four times a day (QID) | ORAL | Status: DC | PRN

## 2013-03-22 MED ORDER — SODIUM CHLORIDE 0.9 % IJ SOLN
3.0000 mL | Freq: Two times a day (BID) | INTRAMUSCULAR | Status: DC
  Administered 2013-03-22: 3 mL via INTRAVENOUS

## 2013-03-22 MED ORDER — THIAMINE HCL 100 MG/ML IJ SOLN
100.0000 mg | Freq: Every day | INTRAMUSCULAR | Status: DC
  Filled 2013-03-22 (×2): qty 1

## 2013-03-22 MED ORDER — LORAZEPAM 2 MG/ML IJ SOLN: 1.0000 mg | Freq: Four times a day (QID) | INTRAMUSCULAR | Status: DC | PRN

## 2013-03-22 NOTE — ED Notes (Signed)
Spoke with pt who is agreeable to [facility]P for rehab at this time.  Pt will need PT/OT consults prior to NH tx.  CSW will continue to follow.

## 2013-03-22 NOTE — ED Notes (Signed)
Spoke with pt's daughter and 2 nieces re: d/c planning for pt.  Pt off unit for testing.  Per family, pt lives at home with his wife and was independent with ADLs up until recently when he fell and hurt shoulder.  Since his fall, pt has not been able to do anything for himself, and wife and family cannot manage his care needs and request [facility] for rehab.  Pt has a managed care Medicare policy (verified by registration) and could be placed from the ED (if he is not admitted) pending insurance approval. CSW will speak with pt to determine whether/not he is agreeable to placement and proceed accordingly.

## 2013-03-22 NOTE — ED Notes (Signed)
Family requesting patient be placed in a rehab facility to gain strength for a couple of days.

## 2013-03-22 NOTE — ED Notes (Signed)
Social worker talking with family members.

## 2013-03-22 NOTE — ED Notes (Signed)
Per EMS: Pt reports fall last week, dislocated left shoulder, seen here. Since dx home pt reports continued pain. Denies any other complaint. AO x4. 150 palp. 88 IRR. 14 RR. GCS 15.

## 2013-03-22 NOTE — ED Notes (Signed)
Phlebtomy at bedside  °

## 2013-03-22 NOTE — ED Notes (Signed)
anette Sharrock  (step Daughter)Home [phone] 2017  [name] (daughter) [phone]

## 2013-03-22 NOTE — Progress Notes (Signed)
Report received at 10:40pm from [place], [place].

## 2013-03-22 NOTE — H&P (Signed)
[facility] History and Physical  Shaun Pena EAV:[id number] DOB: [birth year] DOA: 03/22/2013  Referring physician: ER physician. PCP: No primary provider on file.  Chief Complaint: Weakness.  HPI: Shaun Pena is a 79 y.o. male with history of alcoholism, previous history of diabetes mellitus and hypertension presently on no medications, anemia was brought to the ER by patient's family as patient has not been ambulating since his fall 2 weeks ago. 2 weeks ago patient fell while trying to walk and hurt his left shoulder. In the ER x-rays showed left proximal humerus fracture. CT head and C-spine were unremarkable at that time. Patient was placed in a sling and advised to follow with orthopedic. Since then patient has become more weak and unable to ambulate. Patient has been having increasing pain in her left upper extremity. Since patient has been unable to ambulate he was brought to the ER by the family. In addition patient also had developed right thigh posterior aspect decubitus. Patient denies any chest pain palpitations shortness of breath nausea vomiting diarrhea abdominal pain. In the ER chest x-ray shows possible infiltrates in the right upper lobe. Labs revealed anemia. X-ray showed left proximal humerus fracture and on call Dr. Jillyn Hidden was consulted by the ER physician and Dr. Jillyn Hidden as advised outpatient followup. Patient since has been difficulty walking patient has been on med for further workup. Patient on exam is nonfocal. Patient usually drinks alcohol everyday but over the last 2 weeks has not had any. Initially patient drinks alcohol with his friend who has not visited him since his fall.  Review of Systems: As presented in the history of presenting illness, rest negative.  Past Medical History  Diagnosis Date  . Hyperlipidemia   . Diabetes mellitus   . Anemia     microcytic with MCV = 72 and Hg/Hct WNL   Past Surgical History  Procedure Laterality Date  . No past  surgeries     Social History:  reports that he has been smoking.  He does not have any smokeless tobacco history on file. He reports that he drinks alcohol. He reports that he does not use illicit drugs. Where does patient live home. Can patient participate in ADLs? Not sure.  No Known Allergies  Family History:  Family History  Problem Relation Age of Onset  . Stroke Neg Hx   . Cancer Neg Hx   . Diabetes Mellitus II Other   . CAD Other       Prior to Admission medications   Not on File    Physical Exam: Filed Vitals:   03/22/13 1835 03/22/13 1845 03/22/13 1900 03/22/13 1915  BP: 143/78 145/79 135/74 132/76  Pulse: 89 97 96 95  Temp:      TempSrc:      Resp: 19 18    SpO2: 98% 97% 97% 98%     General:  Well-developed and moderately nourished.  Eyes: Anicteric no pallor.  ENT: No discharge from the ears eyes nose mouth.  Neck: No mass felt.  Cardiovascular: S1-S2 heard.  Respiratory: No rhonchi or crepitations.  Abdomen: Soft nontender bowel sounds present.  Skin: Stage II decubitus in the right hip area.  Musculoskeletal: No edema.  Psychiatric: Appears normal.  Neurologic: Alert and oriented to time place and person. Moves all extremities.  Labs on Admission:  Basic Metabolic Panel:  Recent Labs Lab 03/22/13 1845  NA 133*  K 3.6  CL 93*  CO2 27  GLUCOSE 95  BUN 11  CREATININE 0.78  CALCIUM 9.0   Liver Function Tests:  Recent Labs Lab 03/22/13 1845  AST 18  ALT 6  ALKPHOS 51  BILITOT 0.7  PROT 7.8  ALBUMIN 3.6   No results found for this basename: LIPASE, AMYLASE,  in the last 168 hours No results found for this basename: AMMONIA,  in the last 168 hours CBC:  Recent Labs Lab 03/22/13 1845  WBC 10.1  NEUTROABS 6.5  HGB 12.8*  HCT 36.6*  MCV 68.7*  PLT 272   Cardiac Enzymes:  Recent Labs Lab 03/22/13 1845  TROPONINI <0.30    BNP (last 3 results) No results found for this basename: PROBNP,  in the last 8760  hours CBG: No results found for this basename: GLUCAP,  in the last 168 hours  Radiological Exams on Admission: Dg Chest 2 View  03/22/2013   CLINICAL DATA:  History of trauma complaints of weakness  EXAM: CHEST  2 VIEW  COMPARISON:  06/25/2009  FINDINGS: Lungs hyperinflated. Cardiac silhouette is within normal limits. Aorta is tortuous. Very mild ill-defined area of increased density projects within the right upper lobe region. No focal region of consolidation appreciated. The osseous structures demonstrate degenerative changes within the right shoulder.  IMPRESSION: COPD.  Mild atelectasis versus infiltrate right upper lobe.   Electronically Signed   By: Salome Holmes M.D.   On: 03/22/2013 18:30   Dg Shoulder Left  03/22/2013   CLINICAL DATA:  Trauma.  Weakness.  Fall.  EXAM: LEFT SHOULDER - 2+ VIEW  COMPARISON:  03/09/2013.  FINDINGS: There is increasing displacement of the comminuted proximal left humeral shaft fracture. This occurs at the metaphysis seal diaphyseal junction. 2 cm medial displacement of the shaft relative to the metaphysis. Humeral head appears remain located on the scapular Y-view. Visualized left chest is grossly unremarkable. Severe AC joint osteoarthritis with undersurface spurring.  IMPRESSION: Comminuted proximal left humerus fracture. Increased medial displacement of the proximal humeral shaft relative to the metaphysis.   Electronically Signed   By: Andreas Newport M.D.   On: 03/22/2013 18:35    EKG: Independently reviewed. Sinus rhythm with atrial premature complexes with possible sinus exit block. Look for any A. fib. Discussed with cardiologist on call.  Assessment/Plan Principal Problem:   Weakness Active Problems:   ANEMIA   Alcoholism   1. Generalized weakness - at this time I have placed patient on gentle hydration. Check TSH RPR and anemia panel. Consult physical therapy. Patient is nonfocal on exam. Patient may need placement in  rehabilitation. 2. Abnormal EKG with arrhythmia - patient's EKG shows possible sinus exit pause as discussed with cardiologist. At this time we will closely observe and telemetry. Closely observe any atrial fibrillation. Repeat EKG in a.m. 3. Hypochromic and microcytic anemia - check anemia panel. Stool for occult blood. Closely follow CBC. Patient eventually will need colonoscopic. 4. History of alcoholism and tobacco abuse - patient advised to quit these habits. Patient has been placed on alcohol withdrawal protocol along with thiamine. Patient has not had alcohol for last 2 weeks. 5. Possible aspiration pneumonia - patient has been placed on Unasyn for now. 6. Left proximal humerus fracture - follow with Dr. Jillyn Hidden as outpatient. 7. History of diabetes mellitus presently off medications - check hemoglobin A1c. 8. Stage II decubitus ulcer in the right hip area - consulted wound team.  I have reviewed patient's old charts and compared old labs.    Code Status: Full code.  Family Communication: Family at the  bedside.  Disposition Plan: Admit for observation.    Jahiem Franzoni N. [facility] Pager [phone].  If 7PM-7AM, please contact night-coverage [email or web address] Password [facility] 03/22/2013, 10:11 PM

## 2013-03-22 NOTE — ED Provider Notes (Signed)
CSN: [id number]     Arrival date & time 03/22/13  1713 History   First MD Initiated Contact with Patient 03/22/13 1723     Chief Complaint  Patient presents with  . Shoulder Injury   (Consider location/radiation/quality/duration/timing/severity/associated sxs/prior Treatment) HPI Comments: Patient presents from home with failure to thrive. He had a fall on December 9 and was seen and evaluated for a shoulder fracture. His stepdaughter reports that he has not gotten out of bed since needs complete help with dressing, bathing and eating.Marland Kitchen He's had no new falls. Patient denies any headache, neck pain, chest pain. He complains of pain in the same left shoulder. No weakness, numbness or tingling. Daughter reports a cough at home.  The history is provided by the patient and the EMS personnel.    Past Medical History  Diagnosis Date  . Hyperlipidemia   . Diabetes mellitus   . Anemia     microcytic with MCV = 72 and Hg/Hct WNL   Past Surgical History  Procedure Laterality Date  . No past surgeries     Family History  Problem Relation Age of Onset  . Stroke Neg Hx   . Cancer Neg Hx   . Diabetes Mellitus II Other   . CAD Other    History  Substance Use Topics  . Smoking status: Current Every Day Smoker -- 1.00 packs/day  . Smokeless tobacco: Not on file  . Alcohol Use: Yes    Review of Systems  Constitutional: Negative for fever, activity change and appetite change.  Eyes: Negative for visual disturbance.  Respiratory: Negative for cough, chest tightness and shortness of breath.   Cardiovascular: Negative for chest pain and leg swelling.  Gastrointestinal: Negative for nausea, vomiting and abdominal pain.  Genitourinary: Negative for dysuria and hematuria.  Musculoskeletal: Positive for arthralgias and myalgias. Negative for back pain.  Skin: Negative for rash and wound.  Neurological: Negative for dizziness, weakness and headaches.  A complete 10 system review of systems was  obtained and all systems are negative except as noted in the HPI and PMH.    Allergies  Review of patient's allergies indicates no known allergies.  Home Medications  No current outpatient prescriptions on file. BP 129/73  Pulse 106  Temp(Src) 98.8 F (37.1 C) (Oral)  Resp 18  Ht 6' 2.5" (1.892 m)  Wt 138 lb 10.7 oz (62.9 kg)  BMI 17.57 kg/m2  SpO2 98% Physical Exam  Constitutional: He is oriented to person, place, and time. He appears well-developed and well-nourished. No distress.  disheveled  HENT:  Head: Normocephalic and atraumatic.  Mouth/Throat: Oropharynx is clear and moist. No oropharyngeal exudate.  Eyes: Conjunctivae and EOM are normal. Pupils are equal, round, and reactive to light.  Neck: Normal range of motion. Neck supple.  Cardiovascular: Normal rate, regular rhythm and normal heart sounds.   Pulmonary/Chest: Effort normal and breath sounds normal. No respiratory distress.  Abdominal: Soft. There is no tenderness. There is no rebound and no guarding.  Musculoskeletal: Normal range of motion. He exhibits tenderness. He exhibits no edema.  TTP L shoulder without deformity. Grip strengths equal.   +2 radial pulse  Neurological: He is alert and oriented to person, place, and time. No cranial nerve deficit. He exhibits normal muscle tone. Coordination normal.  Skin: Skin is warm.    ED Course  Procedures (including critical care time) Labs Review Labs Reviewed  CBC WITH DIFFERENTIAL - Abnormal; Notable for the following:    Hemoglobin 12.8 (*)  HCT 36.6 (*)    MCV 68.7 (*)    MCH 24.0 (*)    All other components within normal limits  COMPREHENSIVE METABOLIC PANEL - Abnormal; Notable for the following:    Sodium 133 (*)    Chloride 93 (*)    GFR calc non Af Amer 85 (*)    All other components within normal limits  TROPONIN I  PROTIME-INR  TROPONIN I  RETICULOCYTES  URINALYSIS, ROUTINE W REFLEX MICROSCOPIC  RPR  TSH  VITAMIN B12  FOLATE  IRON AND  TIBC  FERRITIN  COMPREHENSIVE METABOLIC PANEL  CBC WITH DIFFERENTIAL  HEMOGLOBIN A1C  CBC   Imaging Review Dg Chest 2 View  03/22/2013   CLINICAL DATA:  History of trauma complaints of weakness  EXAM: CHEST  2 VIEW  COMPARISON:  06/25/2009  FINDINGS: Lungs hyperinflated. Cardiac silhouette is within normal limits. Aorta is tortuous. Very mild ill-defined area of increased density projects within the right upper lobe region. No focal region of consolidation appreciated. The osseous structures demonstrate degenerative changes within the right shoulder.  IMPRESSION: COPD.  Mild atelectasis versus infiltrate right upper lobe.   Electronically Signed   By: Salome Holmes M.D.   On: 03/22/2013 18:30   Dg Shoulder Left  03/22/2013   CLINICAL DATA:  Trauma.  Weakness.  Fall.  EXAM: LEFT SHOULDER - 2+ VIEW  COMPARISON:  03/09/2013.  FINDINGS: There is increasing displacement of the comminuted proximal left humeral shaft fracture. This occurs at the metaphysis seal diaphyseal junction. 2 cm medial displacement of the shaft relative to the metaphysis. Humeral head appears remain located on the scapular Y-view. Visualized left chest is grossly unremarkable. Severe AC joint osteoarthritis with undersurface spurring.  IMPRESSION: Comminuted proximal left humerus fracture. Increased medial displacement of the proximal humeral shaft relative to the metaphysis.   Electronically Signed   By: Andreas Newport M.D.   On: 03/22/2013 18:35    EKG Interpretation    Date/Time:  Monday March 22 2013 17:39:14 EST Ventricular Rate:  90 PR Interval:  158 QRS Duration: 99 QT Interval:  404 QTC Calculation: 494 R Axis:   -2 Text Interpretation:  Sinus rhythm Atrial premature complex Abnormal R-wave progression, early transition Repol abnrm suggests ischemia, lateral leads Artifact in lead(s) I II aVR aVL aVF No significant change was found Confirmed by Manus Gunning  MD, Dylon Correa ([id number]) on 03/22/2013 6:24:25 PM             MDM   1. Proximal humerus fracture, left, sequela   2. Gait abnormality   3. Alcoholism   4. Anemia, unspecified   5. Weakness    Patient from home with failure to thrive, not eating, not drinking and not taking care of self. Seen on December 9 and diagnosed with proximal humerus fracture. Has not seen orthopedics. He is not wearing a sling on arrival.  X-ray today shows increased displacement of his left humerus fracture. Pulses and sensation and motor function are intact. This was discussed with Dr. Shelle Iron of orthopedics who agrees with sling and nonoperative management at this point.  Labs unremarkable.  CXR with likely atelectasis. No cough or fever to suggest PNA.  Patient unable to stand even with 2 person assist.  Unable to walk. He is unsafe to go home.  Will need admission for PT/OT and probable placement.    Glynn Octave, MD 03/23/13 [phone]

## 2013-03-22 NOTE — ED Notes (Signed)
Shaun Pena [phone]  [phone]

## 2013-03-23 DIAGNOSIS — I10 Essential (primary) hypertension: Secondary | ICD-10-CM

## 2013-03-23 DIAGNOSIS — S42309S Unspecified fracture of shaft of humerus, unspecified arm, sequela: Secondary | ICD-10-CM

## 2013-03-23 DIAGNOSIS — R7309 Other abnormal glucose: Secondary | ICD-10-CM

## 2013-03-23 DIAGNOSIS — E43 Unspecified severe protein-calorie malnutrition: Secondary | ICD-10-CM

## 2013-03-23 LAB — COMPREHENSIVE METABOLIC PANEL
ALT: 8 U/L (ref 0–53)
AST: 20 U/L (ref 0–37)
Albumin: 3.3 g/dL — ABNORMAL LOW (ref 3.5–5.2)
Alkaline Phosphatase: 51 U/L (ref 39–117)
CO2: 24 mEq/L (ref 19–32)
Calcium: 9.1 mg/dL (ref 8.4–10.5)
Chloride: 98 mEq/L (ref 96–112)
Creatinine, Ser: 0.7 mg/dL (ref 0.50–1.35)
GFR calc non Af Amer: 89 mL/min — ABNORMAL LOW (ref >90)
Potassium: 4 mEq/L (ref 3.5–5.1)
Sodium: 135 mEq/L (ref 135–145)

## 2013-03-23 LAB — URINALYSIS, ROUTINE W REFLEX MICROSCOPIC
Bilirubin Urine: NEGATIVE
Glucose, UA: NEGATIVE mg/dL
Ketones, ur: NEGATIVE mg/dL
Nitrite: NEGATIVE
Protein, ur: NEGATIVE mg/dL
pH: 6 (ref 5.0–8.0)

## 2013-03-23 LAB — CBC WITH DIFFERENTIAL/PLATELET
Basophils Relative: 0 % (ref 0–1)
Eosinophils Absolute: 0.1 10*3/uL (ref 0.0–0.7)
HCT: 35.6 % — ABNORMAL LOW (ref 39.0–52.0)
Hemoglobin: 12.1 g/dL — ABNORMAL LOW (ref 13.0–17.0)
MCH: 23.3 pg — ABNORMAL LOW (ref 26.0–34.0)
MCHC: 34 g/dL (ref 30.0–36.0)
MCV: 68.6 fL — ABNORMAL LOW (ref 78.0–100.0)
Monocytes Absolute: 0.9 10*3/uL (ref 0.1–1.0)
Neutro Abs: 4.1 10*3/uL (ref 1.7–7.7)
Neutrophils Relative %: 52 % (ref 43–77)
RBC: 5.19 MIL/uL (ref 4.22–5.81)

## 2013-03-23 LAB — TSH: TSH: 1.275 u[IU]/mL (ref 0.350–4.500)

## 2013-03-23 LAB — TROPONIN I: Troponin I: <0.3 ng/mL (ref <0.30)

## 2013-03-23 LAB — RETICULOCYTES: Retic Count, Absolute: 31.6 10*3/uL (ref 19.0–186.0)

## 2013-03-23 LAB — FOLATE: Folate: 9.3 ng/mL

## 2013-03-23 LAB — IRON AND TIBC
Iron: 29 ug/dL — ABNORMAL LOW (ref 42–135)
TIBC: 235 ug/dL (ref 215–435)

## 2013-03-23 LAB — FERRITIN: Ferritin: 368 ng/mL — ABNORMAL HIGH (ref 22–322)

## 2013-03-23 LAB — VITAMIN B12: Vitamin B-12: 422 pg/mL (ref 211–911)

## 2013-03-23 LAB — HEMOGLOBIN A1C: Mean Plasma Glucose: 120 mg/dL — ABNORMAL HIGH (ref <117)

## 2013-03-23 MED ORDER — ENSURE COMPLETE PO LIQD
237.0000 mL | Freq: Two times a day (BID) | ORAL | Status: DC
  Administered 2013-03-23 – 2013-03-24 (×4): 237 mL via ORAL

## 2013-03-23 MED ORDER — SODIUM CHLORIDE 0.9 % IV SOLN
3.0000 g | Freq: Four times a day (QID) | INTRAVENOUS | Status: DC
  Administered 2013-03-23 – 2013-03-24 (×6): 3 g via INTRAVENOUS
  Filled 2013-03-23 (×8): qty 3

## 2013-03-23 MED ORDER — WHITE PETROLATUM GEL
Status: AC
  Filled 2013-03-23: qty 5

## 2013-03-23 MED ORDER — INFLUENZA VAC SPLIT QUAD 0.5 ML IM SUSP
0.5000 mL | Freq: Once | INTRAMUSCULAR | Status: AC
  Administered 2013-03-23: 12:00:00 0.5 mL via INTRAMUSCULAR
  Filled 2013-03-23: qty 0.5

## 2013-03-23 MED ORDER — PNEUMOCOCCAL VAC POLYVALENT 25 MCG/0.5ML IJ INJ
0.5000 mL | INJECTION | Freq: Once | INTRAMUSCULAR | Status: AC
  Administered 2013-03-23: 12:00:00 0.5 mL via INTRAMUSCULAR
  Filled 2013-03-23: qty 0.5

## 2013-03-23 NOTE — Progress Notes (Signed)
ANTIBIOTIC CONSULT NOTE - INITIAL  Pharmacy Consult for unasyn  Indication: r/o aspiration  pna  No Known Allergies  Patient Measurements: Height: 6' 2.5" (189.2 cm) Weight: 138 lb 10.7 oz (62.9 kg) IBW/kg (Calculated) : 83.35 Adjusted Body Weight:   Vital Signs: Temp: 98.8 F (37.1 C) (12/22 2310) Temp src: Oral (12/22 2310) BP: 129/73 mmHg (12/22 2310) Pulse Rate: 106 (12/22 2310) Intake/Output from previous day: 12/22 0701 - 12/23 0700 In: 240 [P.O.:240] Out: -  Intake/Output from this shift: Total I/O In: 240 [P.O.:240] Out: -   Labs:  Recent Labs  03/22/13 1845  WBC 10.1  HGB 12.8*  PLT 272  CREATININE 0.78   Estimated Creatinine Clearance: 68.8 ml/min (by C-G formula based on Cr of 0.78). No results found for this basename: VANCOTROUGH, VANCOPEAK, VANCORANDOM, GENTTROUGH, GENTPEAK, GENTRANDOM, TOBRATROUGH, TOBRAPEAK, TOBRARND, AMIKACINPEAK, AMIKACINTROU, AMIKACIN,  in the last 72 hours   Microbiology: No results found for this or any previous visit (from the past 720 hour(s)).  Medical History: Past Medical History  Diagnosis Date  . Hyperlipidemia   . Diabetes mellitus   . Anemia     microcytic with MCV = 72 and Hg/Hct WNL    Medications:  No prescriptions prior to admission   Assessment: Possible aspiration PNA in 79 y.o. male with history of alcoholism, previous history of diabetes mellitus and hypertension presently on no medications    Goal of Therapy:   Plan:  unasyn 3 gm q6h   Janice Coffin 03/23/2013,4:02 AM

## 2013-03-23 NOTE — Progress Notes (Addendum)
Notified on call MD of pt BP 166/89. No new orders given, will continue to monitor pt.

## 2013-03-23 NOTE — Progress Notes (Signed)
Pt admitted to unit at 2255, room 5w29. Pt is alert, oriented, and pleasant. Pt presents with a stage 2 pressure ulcer to right buttock, multiple healed pressure ulcers to sacrum, and a healed ulcer to pt left hip prior to admission. Active stage 2 pressure ulcer was covered with pink mepilex dressing. Pt presents with left arm in a sling. No family present at bedside. Full assessment completed to Epic. Call bell placed within reach, pt understands how to use call bell. Will continue to monitor pt per MD orders.

## 2013-03-23 NOTE — Plan of Care (Signed)
Problem: Phase I Progression Outcomes Goal: OOB as tolerated unless otherwise ordered Outcome: Completed/Met Date Met:  03/23/13 OOB to chair with PT

## 2013-03-23 NOTE — Progress Notes (Signed)
INITIAL NUTRITION ASSESSMENT  DOCUMENTATION CODES Per approved criteria  -Severe malnutrition in the context of chronic illness -Underweight   INTERVENTION: Ensure Complete po BID, each supplement provides 350 kcal and 13 grams of protein  NUTRITION DIAGNOSIS: Malnutrition related to chronic illness as evidenced by severe fat and muscle wasting.   Goal: Pt to meet >/= 90% of their estimated nutrition needs   Monitor:  PO intake, weight trend, labs, supplement acceptance  Reason for Assessment: Pt identified as at nutrition risk on the Malnutrition Screen Tool  79 y.o. male  Admitting Dx: Weakness  ASSESSMENT: Pt admitted for increased weakness. Pt fell 2 weeks ago and had a left humerus fracture. Pt was being treated with conservative measures but became unable to care for himself at home. Pt admitted with plans to d/c to SNF. Per MD note pt may need to have ORIF if fx not healing. Pt with hx of alcoholism, pt usually drinks everyday but per notes has not had any in 2 weeks.  No recent weight hx. Pt unsure of his usual weight but is surprised by hearing 138 lb and states that he hasn't been that low in a long time. Feels his clothes fit about the same. Intake history: Pt does not always eat Breakfast, Lunch is a hot meal that his wife prepares, dinner is about the same. Pt with hx of ETOH use. Pt reports no ETOH for the last 2-3 weeks. When pt is drinking he usually starts in the afternoon and agrees that he eats less at dinner as a result.  No family present. WOC RN to see pt for stage II wound.   Nutrition Focused Physical Exam:  Subcutaneous Fat:  Orbital Region: severe wasting Upper Arm Region: severe wasting Thoracic and Lumbar Region: severe wasting  Muscle:  Temple Region: severe wasting Clavicle Bone Region: severe wasting Clavicle and Acromion Bone Region: severe wasting Scapular Bone Region: severe wasting Dorsal Hand: severe wasting Patellar Region: severe  wasting Anterior Thigh Region: severe wasting Posterior Calf Region: severe wasting  Edema: not present   Height: Ht Readings from Last 1 Encounters:  03/22/13 6' 2.5" (1.892 m)    Weight: Wt Readings from Last 1 Encounters:  03/22/13 138 lb 10.7 oz (62.9 kg)    Ideal Body Weight: 87.7 kg   % Ideal Body Weight: 72%  Wt Readings from Last 10 Encounters:  03/22/13 138 lb 10.7 oz (62.9 kg)  05/12/08 201 lb 9.6 oz (91.445 kg)  06/17/07 204 lb 3.2 oz (92.625 kg)  02/09/07 202 lb 1.6 oz (91.672 kg)  12/10/06 196 lb 4.8 oz (89.041 kg)    Usual Body Weight: unknown  % Usual Body Weight: -  BMI:  Body mass index is 17.57 kg/(m^2).  Estimated Nutritional Needs: Kcal: 2000-2200 Protein: 100-115 grams Fluid: > 2 L/day  Skin:  Stage II pressure ulcer on buttocks Multiple healed pressure ulcer sites  Diet Order: Carb Control  EDUCATION NEEDS: -No education needs identified at this time   Intake/Output Summary (Last 24 hours) at 03/23/13 1047 Last data filed at 03/23/13 0855  Gross per 24 hour  Intake    360 ml  Output    300 ml  Net     60 ml    Last BM: 12/23   Labs:   Recent Labs Lab 03/22/13 1845 03/23/13 0505  NA 133* 135  K 3.6 4.0  CL 93* 98  CO2 27 24  BUN 11 11  CREATININE 0.78 0.70  CALCIUM 9.0  9.1  GLUCOSE 95 95    CBG (last 3)  No results found for this basename: GLUCAP,  in the last 72 hours  Scheduled Meds: . ampicillin-sulbactam (UNASYN) IV  3 g Intravenous Q6H  . enoxaparin (LOVENOX) injection  40 mg Subcutaneous Q24H  . folic acid  1 mg Oral Daily  . influenza vac split quadrivalent PF  0.5 mL Intramuscular Once  . multivitamin with minerals  1 tablet Oral Daily  . pneumococcal 23 valent vaccine  0.5 mL Intramuscular Once  . sodium chloride  3 mL Intravenous Q12H  . thiamine  100 mg Oral Daily   Or  . thiamine  100 mg Intravenous Daily    Continuous Infusions: . sodium chloride 75 mL/hr at 03/23/13 0022    Past Medical  History  Diagnosis Date  . Hyperlipidemia   . Diabetes mellitus   . Anemia     microcytic with MCV = 72 and Hg/Hct WNL    Past Surgical History  Procedure Laterality Date  . No past surgeries      Kendell Bane RD, LDN, CNSC [phone] Pager [phone] After Hours Pager

## 2013-03-23 NOTE — Progress Notes (Signed)
Clinical Social Work Department BRIEF PSYCHOSOCIAL ASSESSMENT 03/23/2013  Patient:  Shaun Pena, Shaun Pena     Account Number:  [id number]     Admit date:  03/22/2013  Clinical Social Worker:  Illene Silver  Date/Time:  03/23/2013 05:27 PM  Referred by:  Physician  Date Referred:  03/22/2013 Referred for  SNF Placement   Other Referral:   Interview type:  Family Other interview type:   Patient.    PSYCHOSOCIAL DATA Living Status:  WIFE Admitted from facility:   Level of care:   Primary support name:  daughter breyton vanscyoc Primary support relationship to patient:  CHILD, ADULT Degree of support available:   Afernet Hinson-wife.  Family unable to physically care for pt at this time because he is nearly total care.  Pt has a large family who cares about him and support him emotionally.    CURRENT CONCERNS Current Concerns  Post-Acute Placement   Other Concerns:    SOCIAL WORK ASSESSMENT / PLAN CSW called discussed NHP with pt and family in ED. Pt/family agreeable to placement in [facility]. although they prefer that pt does not go to [place], [place] or [place].    Pt's niece works at [employer] but she is not sure of their bed availability or whether they accept pt's insurance.  Family/pt informed re: bed search process. Unit CSW to facilitate bed search, insurance auth and NH tx.   Assessment/plan status:  Psychosocial Support/Ongoing Assessment of Needs Other assessment/ plan:   Information/referral to community resources:    PATIENT'S/FAMILY'S RESPONSE TO PLAN OF CARE: Pt reluctant to NH initally, but realized that family cannot care for him and he cannot care for himself.  Pt's family provided emotional support to pt.

## 2013-03-23 NOTE — Progress Notes (Signed)
[facility] PROGRESS NOTE  Shaun Pena ZOX:[id number] DOB: 1933/09/04 DOA: 03/22/2013 PCP: No primary provider on file.  Assessment/Plan: Principal Problem:   Weakness - Most likely secondary to deconditioning as well as severe protein calorie malnutrition - Plan will be to place him to skilled nursing facility with physical therapy - Discussed with patient and family and currently they verbalize their understanding and agreement - TSH reviewed and within normal limits, RPR nonreactive  Active Problems:   ANEMIA - No active bleeding currently, hemoglobin steady    Alcoholism - Recommended cessation  Pre diabetes - diabetic diet    Protein-calorie malnutrition, severe - Consult with dietitian, and patient currently obtaining supplementation with Ensure we'll plan on continuing Ensure on discharge  L proximal humerus fx Orthopedic surgery recommended: - continued conservative tx, sling, activity restriction, to allow this fx to heal  NWB L arm   Code Status: Full Family Communication: Discussed with daughter at bedside Disposition Plan: Discussed with nursing and Child psychotherapist. Discharge once bed available at skilled nursing facility   Consultants:  Orthopedic surgery: Dr. Shelle Iron  Procedures:  none  Antibiotics:  Unasyn  HPI/Subjective: No new complaints, no acute issues reported overnight  Objective: Filed Vitals:   03/23/13 1234  BP: 122/73  Pulse: 70  Temp: 98.6 F (37 C)  Resp: 18    Intake/Output Summary (Last 24 hours) at 03/23/13 1420 Last data filed at 03/23/13 0855  Gross per 24 hour  Intake    360 ml  Output    300 ml  Net     60 ml   Filed Weights   03/22/13 2310  Weight: 62.9 kg (138 lb 10.7 oz)    Exam:   General: Pt in NAD, Alert and Awake, patient appears disheveled   Cardiovascular: RRR, no MRG  Respiratory: CTA BL, no wheezes,   Abdomen: soft, NT, ND  Musculoskeletal: no cyanosis or clubbing, left arm in sling    Data Reviewed: Basic Metabolic Panel:  Recent Labs Lab 03/22/13 1845 03/23/13 0505  NA 133* 135  K 3.6 4.0  CL 93* 98  CO2 27 24  GLUCOSE 95 95  BUN 11 11  CREATININE 0.78 0.70  CALCIUM 9.0 9.1   Liver Function Tests:  Recent Labs Lab 03/22/13 1845 03/23/13 0505  AST 18 20  ALT 6 8  ALKPHOS 51 51  BILITOT 0.7 0.5  PROT 7.8 7.4  ALBUMIN 3.6 3.3*   No results found for this basename: LIPASE, AMYLASE,  in the last 168 hours No results found for this basename: AMMONIA,  in the last 168 hours CBC:  Recent Labs Lab 03/22/13 1845 03/23/13 0505  WBC 10.1 7.9  NEUTROABS 6.5 4.1  HGB 12.8* 12.1*  HCT 36.6* 35.6*  MCV 68.7* 68.6*  PLT 272 263   Cardiac Enzymes:  Recent Labs Lab 03/22/13 1845 03/23/13 0008  TROPONINI <0.30 <0.30   BNP (last 3 results) No results found for this basename: PROBNP,  in the last 8760 hours CBG:  Recent Labs Lab 03/23/13 1231  GLUCAP 111*    No results found for this or any previous visit (from the past 240 hour(s)).   Studies: Dg Chest 2 View  03/22/2013   CLINICAL DATA:  History of trauma complaints of weakness  EXAM: CHEST  2 VIEW  COMPARISON:  06/25/2009  FINDINGS: Lungs hyperinflated. Cardiac silhouette is within normal limits. Aorta is tortuous. Very mild ill-defined area of increased density projects within the right upper lobe region. No focal  region of consolidation appreciated. The osseous structures demonstrate degenerative changes within the right shoulder.  IMPRESSION: COPD.  Mild atelectasis versus infiltrate right upper lobe.   Electronically Signed   By: Salome Holmes M.D.   On: 03/22/2013 18:30   Dg Shoulder Left  03/22/2013   CLINICAL DATA:  Trauma.  Weakness.  Fall.  EXAM: LEFT SHOULDER - 2+ VIEW  COMPARISON:  03/09/2013.  FINDINGS: There is increasing displacement of the comminuted proximal left humeral shaft fracture. This occurs at the metaphysis seal diaphyseal junction. 2 cm medial displacement of the  shaft relative to the metaphysis. Humeral head appears remain located on the scapular Y-view. Visualized left chest is grossly unremarkable. Severe AC joint osteoarthritis with undersurface spurring.  IMPRESSION: Comminuted proximal left humerus fracture. Increased medial displacement of the proximal humeral shaft relative to the metaphysis.   Electronically Signed   By: Andreas Newport M.D.   On: 03/22/2013 18:35    Scheduled Meds: . ampicillin-sulbactam (UNASYN) IV  3 g Intravenous Q6H  . enoxaparin (LOVENOX) injection  40 mg Subcutaneous Q24H  . feeding supplement (ENSURE COMPLETE)  237 mL Oral BID BM  . folic acid  1 mg Oral Daily  . multivitamin with minerals  1 tablet Oral Daily  . sodium chloride  3 mL Intravenous Q12H  . thiamine  100 mg Oral Daily   Or  . thiamine  100 mg Intravenous Daily  . white petrolatum       Continuous Infusions: . sodium chloride 75 mL/hr at 03/23/13 0022    Principal Problem:   Weakness Active Problems:   ANEMIA   Alcoholism   Protein-calorie malnutrition, severe    Time spent: > 35     Penny Pia  [facility] Pager [phone] If 7PM-7AM, please contact night-coverage at [email or web address], password [facility] 03/23/2013, 2:20 PM  LOS: 1 day

## 2013-03-23 NOTE — Evaluation (Signed)
Physical Therapy Evaluation Patient Details Name: Shaun Pena MRN: [id number] DOB: 07-05-1933 Today's Date: 03/23/2013 Time: 4098-1191 PT Time Calculation (min): 34 min  PT Assessment / Plan / Recommendation History of Present Illness  Patient is a 79 y.o. male admitted with inability to walk since a fall with left proximal humerus fracture 2 weeks ago.  Normally uses ETOH, but none in past 2 weeks.  Clinical Impression  Patient severely limited with mobility due to fear of falling and balance/gait abnormalities.  Will benefit from skilled PT in the acute setting to allow return home with family assist following SNF level rehab.    PT Assessment  Patient needs continued PT services    Follow Up Recommendations  SNF;Supervision/Assistance - 24 hour    Does the patient have the potential to tolerate intense rehabilitation    N/A  Barriers to Discharge Inaccessible home environment      Equipment Recommendations  None recommended by PT    Recommendations for Other Services   None  Frequency Min 3X/week    Precautions / Restrictions Precautions Precautions: Fall Required Braces or Orthoses: Sling Restrictions Weight Bearing Restrictions: Yes Other Position/Activity Restrictions: No weight bearing order, but likely needs to limit weight on left UE due to prox humerus fx   Pertinent Vitals/Pain C/o left UE pain with mobility, RN aware      Mobility  Bed Mobility Bed Mobility: Supine to Sit;Sitting - Scoot to Edge of Bed Supine to Sit: HOB elevated;3: Mod assist;2: Max assist Sitting - Scoot to [place] of Bed: 3: Mod assist Details for Bed Mobility Assistance: able to get legs off edge of the bed, but needed significant help to lift trunk upright Transfers Transfers: Sit to Stand;Stand to Sit Sit to Stand: From elevated surface;2: Max assist;From bed;From chair/3-in-1 Stand to Sit: To bed;To chair/3-in-1;3: Mod assist;With armrests Details for Transfer Assistance: assist  and cues for anterior weight shift with fear of falling, pt cued to reach for armrest on right side and to not use left UE to assist Ambulation/Gait Ambulation/Gait Assistance: 3: Mod assist Ambulation Distance (Feet): 8 Feet (and 4') Assistive device: 1 person hand held assist Ambulation/Gait Assistance Details: crouched posture due to fear of falling and decreased step length/height with shuffling pattern and posterior bias; walked with his right arm over my shoulder Gait Pattern: Step-to pattern;Decreased stride length;Shuffle;Trunk flexed;Wide base of support;Decreased trunk rotation    Exercises     PT Diagnosis: Abnormality of gait;Generalized weakness  PT Problem List: Decreased strength;Decreased knowledge of use of DME;Decreased activity tolerance;Decreased safety awareness;Decreased mobility;Decreased knowledge of precautions;Pain PT Treatment Interventions: DME instruction;Balance training;Gait training;Functional mobility training;Patient/family education;Therapeutic activities;Therapeutic exercise     PT Goals(Current goals can be found in the care plan section) Acute Rehab PT Goals Patient Stated Goal: To move around easier PT Goal Formulation: With patient Time For Goal Achievement: 04/06/13 Potential to Achieve Goals: Good  Visit Information  Last PT Received On: 03/23/13 Assistance Needed: +2 (for safety due to pt fearful) History of Present Illness: Patient is a 79 y.o. male admitted with inability to walk since a fall with left proximal humerus fracture 2 weeks ago.  Normally uses ETOH, but none in past 2 weeks.       Prior Functioning  Home Living Family/patient expects to be discharged to:: Private residence Living Arrangements: Spouse/significant other;Children Available Help at Discharge: Family Type of Home: House Home Access: Stairs to enter Secretary/administrator of Steps: 4-5 Entrance Stairs-Rails: Left;Right Home Layout: Multi-level Alternate Level  Stairs-Number of Steps: 5 Alternate Level Stairs-Rails: Right Home Equipment: Cane - single point;Grab bars - tub/shower Prior Function Level of Independence: Independent with assistive device(s) Comments: states "mostly" bathes and dresses on his own Communication Communication: No difficulties Dominant Hand: Right    Cognition  Cognition Arousal/Alertness: Awake/alert Behavior During Therapy: WFL for tasks assessed/performed Overall Cognitive Status: Impaired/Different from baseline Area of Impairment: Safety/judgement Orientation Level: Time Safety/Judgement: Decreased awareness of safety General Comments: significant fear of falling    Extremity/Trunk Assessment Lower Extremity Assessment Lower Extremity Assessment: Generalized weakness   Balance Balance Balance Assessed: Yes Static Sitting Balance Static Sitting - Balance Support: Feet supported;Right upper extremity supported Static Sitting - Level of Assistance: 5: Stand by assistance Static Sitting - Comment/# of Minutes: after placed upright pt with ability to sit unassisted initially posterior loss of balance Static Standing Balance Static Standing - Balance Support: Right upper extremity supported Static Standing - Level of Assistance: 4: Min assist Static Standing - Comment/# of Minutes: holding sink with right UE and assist for safety  End of Session PT - End of Session Equipment Utilized During Treatment: Gait belt;Other (comment) (left UE sling) Activity Tolerance: Patient limited by pain;Patient limited by fatigue Patient left: in chair;with chair alarm set Nurse Communication: Mobility status;Patient requests pain meds  GP Functional Assessment Tool Used: Clinical Judgement Functional Limitation: Mobility: Walking and moving around Mobility: Walking and Moving Around Current Status [phone]): At least 40 percent but less than 60 percent impaired, limited or restricted Mobility: Walking and Moving Around Goal  Status [phone]): At least 1 percent but less than 20 percent impaired, limited or restricted   [facility] 03/23/2013, 10:03 AM Sheran Lawless, PT [phone] 03/23/2013

## 2013-03-23 NOTE — Care Management Note (Unsigned)
    Page 1 of 1   03/23/2013     4:22:00 PM   CARE MANAGEMENT NOTE 03/23/2013  Patient:  Shaun Pena, Shaun Pena   Account Number:  [id number]  Date Initiated:  03/23/2013  Documentation initiated by:  Letha Cape  Subjective/Objective Assessment:   dx weakness  admit- lives with spouse,     Action/Plan:   pt eval- rec snf   Anticipated DC Date:  03/24/2013   Anticipated DC Plan:  SKILLED NURSING FACILITY  In-house referral  Clinical Social Worker      DC Planning Services  CM consult      Choice offered to / List presented to:             Status of service:  In process, will continue to follow Medicare Important Message given?   (If response is "NO", the following Medicare IM given date fields will be blank) Date Medicare IM given:   Date Additional Medicare IM given:    Discharge Disposition:    Per UR Regulation:  Reviewed for med. necessity/level of care/duration of stay  If discussed at Long Length of Stay Meetings, dates discussed:    Comments:  03/23/13 16:20 Letha Cape RN, BSN [phone] patient lives with spouse, per physical therapy recs snf, CSW referral, NCM was informed by Melody the WOC that where ever patient goes she will need an air matterss,  NCM informed CSW.

## 2013-03-23 NOTE — Progress Notes (Signed)
UR completed. Patient changed to inpatient r/t requiring IV antibiotics and IVF @ 75cc/hr 

## 2013-03-23 NOTE — Consult Note (Signed)
Reason for Consult: L proximal humerus fx Referring Physician: EDP  Shaun Pena is an 79 y.o. male.  HPI: Pt reports a fall 03/09/13, was initially seen in [facility] ER and dx'd with proximal humerus fx and instructed to follow up as outpt with ortho, but he had not yet followed up. Was brought to the ER by family for worsening weakness. He has been unable to care for himself at home. He is admitted by the medical service and it appears they are working on SNF placement. He denies other injuries from his fall. Denies numbness, tingling. Reports some mild neck stiffness.  Past Medical History  Diagnosis Date  . Hyperlipidemia   . Diabetes mellitus   . Anemia     microcytic with MCV = 72 and Hg/Hct WNL    Past Surgical History  Procedure Laterality Date  . No past surgeries      Family History  Problem Relation Age of Onset  . Stroke Neg Hx   . Cancer Neg Hx   . Diabetes Mellitus II Other   . CAD Other     Social History:  reports that he has been smoking.  He does not have any smokeless tobacco history on file. He reports that he drinks alcohol. He reports that he does not use illicit drugs.  Allergies: No Known Allergies  Medications: I have reviewed the patient's current medications.  Results for orders placed during the hospital encounter of 03/22/13 (from the past 48 hour(s))  CBC WITH DIFFERENTIAL     Status: Abnormal   Collection Time    03/22/13  6:45 PM      Result Value Range   WBC 10.1  4.0 - 10.5 K/uL   RBC 5.33  4.22 - 5.81 MIL/uL   Hemoglobin 12.8 (*) 13.0 - 17.0 g/dL   HCT 16.1 (*) 09.6 - 04.5 %   MCV 68.7 (*) 78.0 - 100.0 fL   MCH 24.0 (*) 26.0 - 34.0 pg   MCHC 35.0  30.0 - 36.0 g/dL   RDW 40.9  81.1 - 91.4 %   Platelets 272  150 - 400 K/uL   Neutrophils Relative % 64  43 - 77 %   Lymphocytes Relative 26  12 - 46 %   Monocytes Relative 9  3 - 12 %   Eosinophils Relative 1  0 - 5 %   Basophils Relative 0  0 - 1 %   Neutro Abs 6.5  1.7 - 7.7 K/uL   Lymphs  Abs 2.6  0.7 - 4.0 K/uL   Monocytes Absolute 0.9  0.1 - 1.0 K/uL   Eosinophils Absolute 0.1  0.0 - 0.7 K/uL   Basophils Absolute 0.0  0.0 - 0.1 K/uL   RBC Morphology TARGET CELLS    COMPREHENSIVE METABOLIC PANEL     Status: Abnormal   Collection Time    03/22/13  6:45 PM      Result Value Range   Sodium 133 (*) 135 - 145 mEq/L   Potassium 3.6  3.5 - 5.1 mEq/L   Chloride 93 (*) 96 - 112 mEq/L   CO2 27  19 - 32 mEq/L   Glucose, Bld 95  70 - 99 mg/dL   BUN 11  6 - 23 mg/dL   Creatinine, Ser 7.82  0.50 - 1.35 mg/dL   Calcium 9.0  8.4 - 95.6 mg/dL   Total Protein 7.8  6.0 - 8.3 g/dL   Albumin 3.6  3.5 - 5.2 g/dL  AST 18  0 - 37 U/L   ALT 6  0 - 53 U/L   Alkaline Phosphatase 51  39 - 117 U/L   Total Bilirubin 0.7  0.3 - 1.2 mg/dL   GFR calc non Af Amer 85 (*) >90 mL/min   GFR calc Af Amer >90  >90 mL/min   Comment: (NOTE)     The eGFR has been calculated using the CKD EPI equation.     This calculation has not been validated in all clinical situations.     eGFR's persistently <90 mL/min signify possible Chronic Kidney     Disease.  TROPONIN I     Status: None   Collection Time    03/22/13  6:45 PM      Result Value Range   Troponin I <0.30  <0.30 ng/mL   Comment:            Due to the release kinetics of cTnI,     a negative result within the first hours     of the onset of symptoms does not rule out     myocardial infarction with certainty.     If myocardial infarction is still suspected,     repeat the test at appropriate intervals.  PROTIME-INR     Status: None   Collection Time    03/22/13  6:45 PM      Result Value Range   Prothrombin Time 13.7  11.6 - 15.2 seconds   INR 1.07  0.00 - 1.49  TROPONIN I     Status: None   Collection Time    03/23/13 12:08 AM      Result Value Range   Troponin I <0.30  <0.30 ng/mL   Comment:            Due to the release kinetics of cTnI,     a negative result within the first hours     of the onset of symptoms does not rule out      myocardial infarction with certainty.     If myocardial infarction is still suspected,     repeat the test at appropriate intervals.  RETICULOCYTES     Status: None   Collection Time    03/23/13 12:08 AM      Result Value Range   Retic Ct Pct 0.6  0.4 - 3.1 %   RBC. 5.26  4.22 - 5.81 MIL/uL   Retic Count, Manual 31.6  19.0 - 186.0 K/uL  URINALYSIS, ROUTINE W REFLEX MICROSCOPIC     Status: None   Collection Time    03/23/13  4:47 AM      Result Value Range   Color, Urine YELLOW  YELLOW   APPearance CLEAR  CLEAR   Specific Gravity, Urine 1.009  1.005 - 1.030   pH 6.0  5.0 - 8.0   Glucose, UA NEGATIVE  NEGATIVE mg/dL   Hgb urine dipstick NEGATIVE  NEGATIVE   Bilirubin Urine NEGATIVE  NEGATIVE   Ketones, ur NEGATIVE  NEGATIVE mg/dL   Protein, ur NEGATIVE  NEGATIVE mg/dL   Urobilinogen, UA 1.0  0.0 - 1.0 mg/dL   Nitrite NEGATIVE  NEGATIVE   Leukocytes, UA NEGATIVE  NEGATIVE   Comment: MICROSCOPIC NOT DONE ON URINES WITH NEGATIVE PROTEIN, BLOOD, LEUKOCYTES, NITRITE, OR GLUCOSE <1000 mg/dL.  COMPREHENSIVE METABOLIC PANEL     Status: Abnormal   Collection Time    03/23/13  5:05 AM      Result Value Range   Sodium  135  135 - 145 mEq/L   Potassium 4.0  3.5 - 5.1 mEq/L   Chloride 98  96 - 112 mEq/L   CO2 24  19 - 32 mEq/L   Glucose, Bld 95  70 - 99 mg/dL   BUN 11  6 - 23 mg/dL   Creatinine, Ser 1.61  0.50 - 1.35 mg/dL   Calcium 9.1  8.4 - 09.6 mg/dL   Total Protein 7.4  6.0 - 8.3 g/dL   Albumin 3.3 (*) 3.5 - 5.2 g/dL   AST 20  0 - 37 U/L   ALT 8  0 - 53 U/L   Alkaline Phosphatase 51  39 - 117 U/L   Total Bilirubin 0.5  0.3 - 1.2 mg/dL   GFR calc non Af Amer 89 (*) >90 mL/min   GFR calc Af Amer >90  >90 mL/min   Comment: (NOTE)     The eGFR has been calculated using the CKD EPI equation.     This calculation has not been validated in all clinical situations.     eGFR's persistently <90 mL/min signify possible Chronic Kidney     Disease.  CBC WITH DIFFERENTIAL     Status:  Abnormal   Collection Time    03/23/13  5:05 AM      Result Value Range   WBC 7.9  4.0 - 10.5 K/uL   RBC 5.19  4.22 - 5.81 MIL/uL   Hemoglobin 12.1 (*) 13.0 - 17.0 g/dL   HCT 04.5 (*) 40.9 - 81.1 %   MCV 68.6 (*) 78.0 - 100.0 fL   MCH 23.3 (*) 26.0 - 34.0 pg   MCHC 34.0  30.0 - 36.0 g/dL   RDW 91.4  78.2 - 95.6 %   Platelets 263  150 - 400 K/uL   Neutrophils Relative % 52  43 - 77 %   Lymphocytes Relative 36  12 - 46 %   Monocytes Relative 11  3 - 12 %   Eosinophils Relative 1  0 - 5 %   Basophils Relative 0  0 - 1 %   Neutro Abs 4.1  1.7 - 7.7 K/uL   Lymphs Abs 2.8  0.7 - 4.0 K/uL   Monocytes Absolute 0.9  0.1 - 1.0 K/uL   Eosinophils Absolute 0.1  0.0 - 0.7 K/uL   Basophils Absolute 0.0  0.0 - 0.1 K/uL   RBC Morphology TARGET CELLS     Comment: ELLIPTOCYTES   WBC Morphology ATYPICAL LYMPHOCYTES    OCCULT BLOOD X 1 CARD TO LAB, STOOL     Status: None   Collection Time    03/23/13  5:23 AM      Result Value Range   Fecal Occult Bld NEGATIVE  NEGATIVE    Dg Chest 2 View  03/22/2013   CLINICAL DATA:  History of trauma complaints of weakness  EXAM: CHEST  2 VIEW  COMPARISON:  06/25/2009  FINDINGS: Lungs hyperinflated. Cardiac silhouette is within normal limits. Aorta is tortuous. Very mild ill-defined area of increased density projects within the right upper lobe region. No focal region of consolidation appreciated. The osseous structures demonstrate degenerative changes within the right shoulder.  IMPRESSION: COPD.  Mild atelectasis versus infiltrate right upper lobe.   Electronically Signed   By: Salome Holmes M.D.   On: 03/22/2013 18:30   Dg Shoulder Left  03/22/2013   CLINICAL DATA:  Trauma.  Weakness.  Fall.  EXAM: LEFT SHOULDER - 2+ VIEW  COMPARISON:  03/09/2013.  FINDINGS: There is increasing displacement of the comminuted proximal left humeral shaft fracture. This occurs at the metaphysis seal diaphyseal junction. 2 cm medial displacement of the shaft relative to the  metaphysis. Humeral head appears remain located on the scapular Y-view. Visualized left chest is grossly unremarkable. Severe AC joint osteoarthritis with undersurface spurring.  IMPRESSION: Comminuted proximal left humerus fracture. Increased medial displacement of the proximal humeral shaft relative to the metaphysis.   Electronically Signed   By: Andreas Newport M.D.   On: 03/22/2013 18:35    Review of Systems  HENT: Negative.   Eyes: Negative.   Respiratory: Negative.   Cardiovascular: Negative.   Gastrointestinal: Negative.   Genitourinary: Negative.   Musculoskeletal: Positive for joint pain.  Skin: Negative.   Neurological: Positive for weakness.  Psychiatric/Behavioral: Negative.    Blood pressure 102/64, pulse 97, temperature 98.7 F (37.1 C), temperature source Oral, resp. rate 20, height 6' 2.5" (1.892 m), weight 62.9 kg (138 lb 10.7 oz), SpO2 99.00%. Physical Exam  Constitutional: He is oriented to person, place, and time. He appears well-developed and well-nourished.  HENT:  Head: Normocephalic and atraumatic.  Eyes: Conjunctivae and EOM are normal. Pupils are equal, round, and reactive to light.  Neck: Normal range of motion. Neck supple.  Cardiovascular: Normal rate and regular rhythm.   Respiratory: Effort normal and breath sounds normal.  GI: Soft. Bowel sounds are normal.  Musculoskeletal:  Sling in place left arm Generalized tenderness left shoulder, proximal humerus Unable to eval motion L shoulder due to known fx and pain nontender left elbow, forearm, wrist, hand Able to flex and extend fingers, wrist, elbow Sensation intact, NVI distally, good cap refill  Neurological: He is alert and oriented to person, place, and time. He has normal reflexes.  Skin: Skin is warm and dry.  Psychiatric: He has a normal mood and affect.   xrays with comminuted, displaced left proximal humeral shaft fx (metaphyseal)  Assessment/Plan: L proximal humerus fx  Pt admitted to  medical service for weakness, medical mgmt, SNF placement Recommend continued conservative tx, sling, activity restriction, to allow this fx to heal NWB L arm Discussed possible need for ORIF if nonhealing, continued severe pain, nonunion/malunion Discussed likely loss of some ROM with this fx, even if it is tx'd surgically Instructed on proper sling usage Discussed with Dr. Shelle Iron Recommend follow up in office next week with Dr. Ranell Patrick to eval for possible ORIF   BISSELL, JACLYN M. 03/23/2013, 8:00 AM

## 2013-03-23 NOTE — Consult Note (Signed)
WOC wound consult note Reason for Consult: evaluation of bruise.  Pt with bilateral trocanter sDTI (suspected deep tissue injury).  Per his family he has experienced decline and fall at home and has been essentially bedbound for 2 wks.  Wound type: Pressure ulcers x 2 Pressure Ulcer POA: Yes x 2 Measurement: Right trochanter: 4cm x 5cm  Left trochanter: 3cm x 4cm  Wound bed: Right deep, maroon tissue with some partial thickness skin sloughing Left: deep maroon tissue, intact  Both indicative of deep tissue injury from pressure Drainage (amount, consistency, odor) minimal from the right, none from the left Periwound: intact Dressing procedure/placement/frequency: Foam dressings for the bilateral trochanter wounds, add air mattress for pressure redistribution.   Notified CM of the need to have LALM at the time of discharge.   Discussed POC with patient and bedside nurse.  Re consult if needed, will not follow at this time. Thanks  Mehran Guderian [employer], CWOCN [phone])

## 2013-03-24 DIAGNOSIS — J189 Pneumonia, unspecified organism: Principal | ICD-10-CM | POA: Diagnosis present

## 2013-03-24 DIAGNOSIS — S42209A Unspecified fracture of upper end of unspecified humerus, initial encounter for closed fracture: Secondary | ICD-10-CM

## 2013-03-24 MED ORDER — AMOXICILLIN-POT CLAVULANATE 875-125 MG PO TABS: 1.0000 | ORAL_TABLET | Freq: Two times a day (BID) | ORAL | Status: DC

## 2013-03-24 MED ORDER — ADULT MULTIVITAMIN W/MINERALS CH: 1.0000 | ORAL_TABLET | Freq: Every day | ORAL | Status: DC

## 2013-03-24 MED ORDER — FOLIC ACID 1 MG PO TABS: 1.0000 mg | ORAL_TABLET | Freq: Every day | ORAL | Status: DC

## 2013-03-24 MED ORDER — HYDROCODONE-ACETAMINOPHEN 5-325 MG PO TABS: 1.0000 | ORAL_TABLET | Freq: Four times a day (QID) | ORAL | Status: DC | PRN

## 2013-03-24 NOTE — Discharge Summary (Signed)
Physician Discharge Summary  Shaun Pena ZOX:[id number] DOB: [birth year] DOA: 03/22/2013  PCP: No primary provider on file.  Admit date: 03/22/2013 Discharge date: 03/24/2013  Time spent: 40 minutes  Recommendations for Outpatient Follow-up:  1. Followup with Dr. Ranell Patrick as outpatient in one week  Discharge Diagnoses:  Principal Problem:   Weakness Active Problems:   ANEMIA   Alcoholism   Protein-calorie malnutrition, severe   Prediabetes   Proximal humerus fracture   CAP (community acquired pneumonia)   Discharge Condition: Stable  Diet recommendation: Regular diet  Filed Weights   03/22/13 2310  Weight: 62.9 kg (138 lb 10.7 oz)    History of present illness:  Shaun Pena is a 79 y.o. male with history of alcoholism, previous history of diabetes mellitus and hypertension presently on no medications, anemia was brought to the ER by patient's family as patient has not been ambulating since his fall 2 weeks ago. 2 weeks ago patient fell while trying to walk and hurt his left shoulder. In the ER x-rays showed left proximal humerus fracture. CT head and C-spine were unremarkable at that time. Patient was placed in a sling and advised to follow with orthopedic. Since then patient has become more weak and unable to ambulate. Patient has been having increasing pain in her left upper extremity. Since patient has been unable to ambulate he was brought to the ER by the family. In addition patient also had developed right thigh posterior aspect decubitus. Patient denies any chest pain palpitations shortness of breath nausea vomiting diarrhea abdominal pain. In the ER chest x-ray shows possible infiltrates in the right upper lobe. Labs revealed anemia. X-ray showed left proximal humerus fracture and on call Dr. Jillyn Hidden was consulted by the ER physician and Dr. Jillyn Hidden as advised outpatient followup. Patient since has been difficulty walking patient has been on med for further workup. Patient on  exam is nonfocal. Patient usually drinks alcohol everyday but over the last 2 weeks has not had any. Initially patient drinks alcohol with his friend who has not visited him since his fall.  Hospital Course:   1. Community-acquired pneumonia: Patient admitted to the hospital because of generalized weakness, chest x-ray showed right upper lobe infiltrates and patient does have low-grade fever or process consistent with pneumonia. Patient started on Unasyn at the time of admission, the patient did have history of alcoholism and this could be secondary to aspiration. At the time of discharge patient started on Augmentin.   2. Generalized weakness: Likely deconditioning from severe 14-calorie malnutrition and pneumonia. Patient to be a nursing home for further PT needs. TSH and RPR are negative.  3. Anemia: Likely chronic anemia, iron panel showed low iron and high ferritin consistent with anemia of chronic disease with low iron, folate is in the normal low side so folate supplementation started.  4. Recent fracture of the left proximal humerus: This is happened recently as patient was presented to the emergency department and then sent home after orthopedics sees him and recommended nonsurgical management. Then he started to be not able to manage and has to come back to the hospital because of above-mentioned complaints. Orthopedics consulted again, recommended to continue conservative treatment, sling, activity restriction to allow fracture to heal, nonweightbearing on left arm.  5. History of alcohol abuse: Upon admission, started on CIWA protocol if develops symptoms was placed on Ativan when necessary. Patient now was 4 days of of drinking (he stopped drinking 2 days prior to admission) has no symptoms.  Procedures:  None  Consultations:  Orthopedics   Discharge Exam: Filed Vitals:   03/24/13 0310  BP: 134/74  Pulse: 84  Temp: 99.3 F (37.4 C)  Resp: 18   General: Alert and awake,  oriented x3, not in any acute distress. Somewhat disheveled, unshaven and long nails. HEENT: anicteric sclera, pupils reactive to light and accommodation, EOMI CVS: S1-S2 clear, no murmur rubs or gallops Chest: clear to auscultation bilaterally, no wheezing, rales or rhonchi Abdomen: soft nontender, nondistended, normal bowel sounds, no organomegaly Extremities: no cyanosis, clubbing or edema noted bilaterally Neuro: Cranial nerves II-XII intact, no focal neurological deficits  Discharge Instructions  Discharge Orders   Future Orders Complete By Expires   Diet - low sodium heart healthy  As directed    Increase activity slowly  As directed        Medication List         amoxicillin-clavulanate 875-125 MG per tablet  Commonly known as:  AUGMENTIN  Take 1 tablet by mouth 2 (two) times daily.     folic acid 1 MG tablet  Commonly known as:  FOLVITE  Take 1 tablet (1 mg total) by mouth daily.     HYDROcodone-acetaminophen 5-325 MG per tablet  Commonly known as:  NORCO  Take 1 tablet by mouth every 6 (six) hours as needed for moderate pain.     multivitamin with minerals Tabs tablet  Take 1 tablet by mouth daily.       No Known Allergies     Follow-up Information   Follow up with NORRIS,STEVEN R, MD In 1 week.   Specialty:  Orthopedic Surgery   Contact information:   [address] [place] [address] [phone]        The results of significant diagnostics from this hospitalization (including imaging, microbiology, ancillary and laboratory) are listed below for reference.    Significant Diagnostic Studies: Dg Chest 2 View  03/22/2013   CLINICAL DATA:  History of trauma complaints of weakness  EXAM: CHEST  2 VIEW  COMPARISON:  06/25/2009  FINDINGS: Lungs hyperinflated. Cardiac silhouette is within normal limits. Aorta is tortuous. Very mild ill-defined area of increased density projects within the right upper lobe region. No focal region of  consolidation appreciated. The osseous structures demonstrate degenerative changes within the right shoulder.  IMPRESSION: COPD.  Mild atelectasis versus infiltrate right upper lobe.   Electronically Signed   By: Salome Holmes M.D.   On: 03/22/2013 18:30   Ct Head Wo Contrast  03/09/2013   CLINICAL DATA:  Status post fall out of bed; concern for head or cervical spine injury.  EXAM: CT HEAD WITHOUT CONTRAST  CT CERVICAL SPINE WITHOUT CONTRAST  TECHNIQUE: Multidetector CT imaging of the head and cervical spine was performed following the standard protocol without intravenous contrast. Multiplanar CT image reconstructions of the cervical spine were also generated.  COMPARISON:  CT of the head performed 06/25/2009  FINDINGS: CT HEAD FINDINGS  There is no evidence of acute infarction, mass lesion, or intra- or extra-axial hemorrhage on CT.  Prominence of the ventricles and sulci reflects mild to moderate cortical volume loss. Scattered periventricular and subcortical white matter change likely reflects small vessel ischemic microangiopathy, most prominent at the right occipital lobe. Mild cerebellar atrophy is noted.  The brainstem and fourth ventricle are within normal limits. The basal ganglia are unremarkable in appearance. The cerebral hemispheres demonstrate grossly normal gray-white differentiation. No mass effect or midline shift is seen.  There is  no evidence of fracture; visualized osseous structures are unremarkable in appearance. The orbits are within normal limits. The paranasal sinuses and mastoid air cells are well-aerated. No significant soft tissue abnormalities are seen.  CT CERVICAL SPINE FINDINGS  There is no evidence of fracture or subluxation. Vertebral bodies demonstrate normal height and alignment. There is mild narrowing of the intervertebral disc space at C5-C6, with scattered anterior osteophytes seen along the cervical spine. Mild facet disease is noted along the cervical spine.  Prevertebral soft tissues are within normal limits.  The visualized portions of the thyroid gland are unremarkable in appearance. The visualized lung apices are clear. Calcification is noted at the carotid bifurcations bilaterally.  IMPRESSION: 1. No evidence of traumatic intracranial injury or fracture. 2. No evidence of fracture or subluxation along the cervical spine. 3. Mild degenerative change along the cervical spine. 4. Mild to moderate cortical volume loss and scattered small vessel ischemic microangiopathy. 5. Calcification at the carotid bifurcations bilaterally.   Electronically Signed   By: Roanna Raider M.D.   On: 03/09/2013 01:28   Ct Cervical Spine Wo Contrast  03/09/2013   CLINICAL DATA:  Status post fall out of bed; concern for head or cervical spine injury.  EXAM: CT HEAD WITHOUT CONTRAST  CT CERVICAL SPINE WITHOUT CONTRAST  TECHNIQUE: Multidetector CT imaging of the head and cervical spine was performed following the standard protocol without intravenous contrast. Multiplanar CT image reconstructions of the cervical spine were also generated.  COMPARISON:  CT of the head performed 06/25/2009  FINDINGS: CT HEAD FINDINGS  There is no evidence of acute infarction, mass lesion, or intra- or extra-axial hemorrhage on CT.  Prominence of the ventricles and sulci reflects mild to moderate cortical volume loss. Scattered periventricular and subcortical white matter change likely reflects small vessel ischemic microangiopathy, most prominent at the right occipital lobe. Mild cerebellar atrophy is noted.  The brainstem and fourth ventricle are within normal limits. The basal ganglia are unremarkable in appearance. The cerebral hemispheres demonstrate grossly normal gray-white differentiation. No mass effect or midline shift is seen.  There is no evidence of fracture; visualized osseous structures are unremarkable in appearance. The orbits are within normal limits. The paranasal sinuses and mastoid air  cells are well-aerated. No significant soft tissue abnormalities are seen.  CT CERVICAL SPINE FINDINGS  There is no evidence of fracture or subluxation. Vertebral bodies demonstrate normal height and alignment. There is mild narrowing of the intervertebral disc space at C5-C6, with scattered anterior osteophytes seen along the cervical spine. Mild facet disease is noted along the cervical spine. Prevertebral soft tissues are within normal limits.  The visualized portions of the thyroid gland are unremarkable in appearance. The visualized lung apices are clear. Calcification is noted at the carotid bifurcations bilaterally.  IMPRESSION: 1. No evidence of traumatic intracranial injury or fracture. 2. No evidence of fracture or subluxation along the cervical spine. 3. Mild degenerative change along the cervical spine. 4. Mild to moderate cortical volume loss and scattered small vessel ischemic microangiopathy. 5. Calcification at the carotid bifurcations bilaterally.   Electronically Signed   By: Roanna Raider M.D.   On: 03/09/2013 01:28   Dg Shoulder Left  03/22/2013   CLINICAL DATA:  Trauma.  Weakness.  Fall.  EXAM: LEFT SHOULDER - 2+ VIEW  COMPARISON:  03/09/2013.  FINDINGS: There is increasing displacement of the comminuted proximal left humeral shaft fracture. This occurs at the metaphysis seal diaphyseal junction. 2 cm medial displacement of the shaft relative  to the metaphysis. Humeral head appears remain located on the scapular Y-view. Visualized left chest is grossly unremarkable. Severe AC joint osteoarthritis with undersurface spurring.  IMPRESSION: Comminuted proximal left humerus fracture. Increased medial displacement of the proximal humeral shaft relative to the metaphysis.   Electronically Signed   By: Andreas Newport M.D.   On: 03/22/2013 18:35   Dg Shoulder Left  03/09/2013   CLINICAL DATA:  Status post fall; pain and deformity at the left shoulder.  EXAM: LEFT SHOULDER - 2+ VIEW  COMPARISON:   None.  FINDINGS: There is a displaced comminuted fracture involving the surgical neck of the left humerus, with approximately 1/2 shaft width medial displacement of the distal humerus. Small adjacent cortical fragments are seen.  The humeral head remains seated at the glenoid fossa, with mild associated degenerative change noted along the greater tuberosity. Mild degenerative change is seen at the left acromioclavicular joint. No additional fractures are seen.  IMPRESSION: Displaced comminuted fracture involving the surgical neck of the left humerus, with approximately 1/2 shaft width medial displacement of the distal humerus. Small adjacent cortical fragments seen.   Electronically Signed   By: Roanna Raider M.D.   On: 03/09/2013 01:16    Microbiology: No results found for this or any previous visit (from the past 240 hour(s)).   Labs: Basic Metabolic Panel:  Recent Labs Lab 03/22/13 1845 03/23/13 0505  NA 133* 135  K 3.6 4.0  CL 93* 98  CO2 27 24  GLUCOSE 95 95  BUN 11 11  CREATININE 0.78 0.70  CALCIUM 9.0 9.1   Liver Function Tests:  Recent Labs Lab 03/22/13 1845 03/23/13 0505  AST 18 20  ALT 6 8  ALKPHOS 51 51  BILITOT 0.7 0.5  PROT 7.8 7.4  ALBUMIN 3.6 3.3*   No results found for this basename: LIPASE, AMYLASE,  in the last 168 hours No results found for this basename: AMMONIA,  in the last 168 hours CBC:  Recent Labs Lab 03/22/13 1845 03/23/13 0505  WBC 10.1 7.9  NEUTROABS 6.5 4.1  HGB 12.8* 12.1*  HCT 36.6* 35.6*  MCV 68.7* 68.6*  PLT 272 263   Cardiac Enzymes:  Recent Labs Lab 03/22/13 1845 03/23/13 0008  TROPONINI <0.30 <0.30   BNP: BNP (last 3 results) No results found for this basename: PROBNP,  in the last 8760 hours CBG:  Recent Labs Lab 03/23/13 1231  GLUCAP 111*       Signed:  Kambree Krauss A  [facility] 03/24/2013, 11:28 AM

## 2013-03-24 NOTE — Progress Notes (Signed)
[name] to be D/C'd Skilled nursing facility per MD order.  Discussed with the patient and all questions fully answered.    Medication List         amoxicillin-clavulanate 875-125 MG per tablet  Commonly known as:  AUGMENTIN  Take 1 tablet by mouth 2 (two) times daily.     folic acid 1 MG tablet  Commonly known as:  FOLVITE  Take 1 tablet (1 mg total) by mouth daily.     HYDROcodone-acetaminophen 5-325 MG per tablet  Commonly known as:  NORCO  Take 1 tablet by mouth every 6 (six) hours as needed for moderate pain.     multivitamin with minerals Tabs tablet  Take 1 tablet by mouth daily.        VVS, Skin clean, dry and intact without evidence of skin break down, no evidence of skin tears noted. IV catheter discontinued intact. Site without signs and symptoms of complications. Dressing and pressure applied.  An After Visit Summary was printed and given to the SNF.    Patient escorted via strecther, and D/C to SNF via EMS.  Beckey Downing F 03/24/2013 2:51 PM

## 2013-03-24 NOTE — Progress Notes (Signed)
Report called to [facility] healthcare. Given to [place].

## 2013-03-24 NOTE — Clinical Social Work Placement (Signed)
Clinical Social Work Department CLINICAL SOCIAL WORK PLACEMENT NOTE 03/24/2013  Patient:  Shaun Pena, Shaun Pena  Account Number:  [id number] Admit date:  03/22/2013  Clinical Social Worker:  Cherre Blanc, [place]  Date/time:  03/24/2013 11:20 AM  Clinical Social Work is seeking post-discharge placement for this patient at the following level of care:   SKILLED NURSING   (*CSW will update this form in Epic as items are completed)   03/23/2013  Patient/family provided with [facility] Department of Clinical Social Work's list of facilities offering this level of care within the geographic area requested by the patient (or if unable, by the patient's family).  03/23/2013  Patient/family informed of their freedom to choose among providers that offer the needed level of care, that participate in Medicare, Medicaid or managed care program needed by the patient, have an available bed and are willing to accept the patient.  03/23/2013  Patient/family informed of [facility]' ownership interest in [facility], as well as of the fact that they are under no obligation to receive care at this facility.  PASARR submitted to EDS on 03/23/2013 PASARR number received from [employer] on 03/23/2013  FL2 transmitted to all facilities in geographic area requested by pt/family on  03/23/2013 FL2 transmitted to all facilities within larger geographic area on   Patient informed that his/her managed care company has contracts with or will negotiate with  certain facilities, including the following:     Patient/family informed of bed offers received:  03/24/2013 Patient chooses bed at [facility] Physician recommends and patient chooses bed at    Patient to be transferred to [facility] on  03/24/2013 Patient to be transferred to facility by Ambulance  The following physician request were entered in Epic:   Additional Comments: Per MD patient ready to DC to  [facility] 03/24/13. RN, Patient, daughter, and facility notified of discharge. Ambulance transport will be requested for patient. DC packet left on chart. CSW signing off.   Roddie Mc, [place], [place], [phone]

## 2013-04-12 ENCOUNTER — Ambulatory Visit: Payer: Self-pay | Admitting: Podiatry

## 2013-04-26 ENCOUNTER — Encounter: Payer: Self-pay | Admitting: Podiatry

## 2013-04-26 ENCOUNTER — Ambulatory Visit (INDEPENDENT_AMBULATORY_CARE_PROVIDER_SITE_OTHER): Payer: PRIVATE HEALTH INSURANCE | Admitting: Podiatry

## 2013-04-26 VITALS — BP 125/68 | HR 73 | Resp 18

## 2013-04-26 DIAGNOSIS — M79609 Pain in unspecified limb: Secondary | ICD-10-CM

## 2013-04-26 DIAGNOSIS — B351 Tinea unguium: Secondary | ICD-10-CM

## 2013-04-26 NOTE — Progress Notes (Signed)
° °  Subjective:    Patient ID: Shaun Pena, male    DOB: [birth year], 80 y.o.   MRN: [id number]  HPI The daughter states that the toenails are long and thick and need to be trimmed up and fractured his left shoulder about a month ago No prior podiatric care.  Review of Systems  Constitutional: Positive for activity change and unexpected weight change.  HENT: Positive for hearing loss.   Eyes: Negative.   Respiratory: Negative.   Cardiovascular: Negative.   Gastrointestinal: Negative.   Endocrine: Negative.   Genitourinary: Negative.   Musculoskeletal: Negative.   Skin: Negative.   Allergic/Immunologic: Negative.   Neurological: Positive for numbness.  Hematological: Negative.   Psychiatric/Behavioral: Negative.        Objective:   Physical Exam This patient presents with his daughter and appears orientated x3  Vascular: The DP and PT pulses are 2/4 bilaterally  Neurological: Knee and ankle reflexes are trace reactive bilaterally  Dermatological: Atrophic skin without any hair growth noted bilaterally. Extreme hypertrophic, elongated, discolored toenails x10. The nails are up to 1 inch thick and extend 1-2 inches beyond the distal toes.  Musculoskeletal: Hammering of lateral digits noted bilaterally.       Assessment & Plan:   Assessment: Extremely neglected symptomatic onychomycoses x10  Plan: All 10 toenails were debrided without any bleeding. Patient and daughter advised to return at three-month intervals.

## 2013-04-27 ENCOUNTER — Encounter: Payer: Self-pay | Admitting: Podiatry

## 2013-07-26 ENCOUNTER — Ambulatory Visit: Payer: Medicare Other | Admitting: Podiatry

## 2013-08-09 ENCOUNTER — Ambulatory Visit: Payer: PRIVATE HEALTH INSURANCE | Admitting: Podiatry

## 2016-08-25 ENCOUNTER — Encounter (HOSPITAL_COMMUNITY): Payer: Self-pay | Admitting: Oncology

## 2016-08-25 ENCOUNTER — Emergency Department (HOSPITAL_COMMUNITY)
Admission: EM | Admit: 2016-08-25 | Discharge: 2016-08-25 | Disposition: A | Discharge status: Home / Self Care | Payer: Medicare Other | Attending: Emergency Medicine | Admitting: Emergency Medicine

## 2016-08-25 ENCOUNTER — Emergency Department (HOSPITAL_COMMUNITY): Payer: Medicare Other

## 2016-08-25 DIAGNOSIS — F17200 Nicotine dependence, unspecified, uncomplicated: Secondary | ICD-10-CM | POA: Diagnosis not present

## 2016-08-25 DIAGNOSIS — Y9389 Activity, other specified: Secondary | ICD-10-CM | POA: Insufficient documentation

## 2016-08-25 DIAGNOSIS — E119 Type 2 diabetes mellitus without complications: Secondary | ICD-10-CM | POA: Diagnosis not present

## 2016-08-25 DIAGNOSIS — W19XXXA Unspecified fall, initial encounter: Secondary | ICD-10-CM | POA: Diagnosis not present

## 2016-08-25 DIAGNOSIS — S4991XA Unspecified injury of right shoulder and upper arm, initial encounter: Secondary | ICD-10-CM | POA: Diagnosis present

## 2016-08-25 DIAGNOSIS — S42214A Unspecified nondisplaced fracture of surgical neck of right humerus, initial encounter for closed fracture: Secondary | ICD-10-CM | POA: Diagnosis not present

## 2016-08-25 DIAGNOSIS — I10 Essential (primary) hypertension: Secondary | ICD-10-CM | POA: Insufficient documentation

## 2016-08-25 DIAGNOSIS — Y929 Unspecified place or not applicable: Secondary | ICD-10-CM | POA: Diagnosis not present

## 2016-08-25 DIAGNOSIS — Y999 Unspecified external cause status: Secondary | ICD-10-CM | POA: Insufficient documentation

## 2016-08-25 MED ORDER — KETOROLAC TROMETHAMINE 60 MG/2ML IM SOLN
60.0000 mg | Freq: Once | INTRAMUSCULAR | Status: AC
  Administered 2016-08-25: 60 mg via INTRAMUSCULAR
  Filled 2016-08-25: qty 2

## 2016-08-25 MED ORDER — HYDROMORPHONE HCL 1 MG/ML IJ SOLN
1.0000 mg | Freq: Once | INTRAMUSCULAR | Status: DC
  Filled 2016-08-25: qty 1

## 2016-08-25 MED ORDER — ACETAMINOPHEN 500 MG PO TABS
1000.0000 mg | ORAL_TABLET | Freq: Once | ORAL | Status: AC
  Administered 2016-08-25: 1000 mg via ORAL
  Filled 2016-08-25: qty 2

## 2016-08-25 NOTE — ED Notes (Signed)
Per family pt was intoxicated last night prior to fall.  Family also states that pt did hit his head.

## 2016-08-25 NOTE — ED Notes (Signed)
Patient transported to X-ray 

## 2016-08-25 NOTE — ED Provider Notes (Signed)
[facility]-EMERGENCY DEPT Provider Note   CSN: [id number] Arrival date & time: 08/25/16  1913     History   Chief Complaint Chief Complaint  Patient presents with  . Shoulder Pain    HPI HERVEY WEDIG is a 83 y.o. male.  Report by palpation, he has a history of alcohol abuse. Last night he was taking heavily. Had a mechanical fall. Immediate pain in his right shoulder. Denies any other injuries.    The history is provided by the patient.  Illness  This is a new problem. The current episode started yesterday. The problem occurs constantly. The problem has not changed since onset.Pertinent negatives include no chest pain, no abdominal pain and no headaches. He has tried nothing for the symptoms.    Past Medical History:  Diagnosis Date  . Anemia    microcytic with MCV = 72 and Hg/Hct WNL  . Diabetes mellitus   . Hyperlipidemia     Patient Active Problem List   Diagnosis Date Noted  . Proximal humerus fracture 03/24/2013  . CAP (community acquired pneumonia) 03/24/2013  . Protein-calorie malnutrition, severe (HCC) 03/23/2013  . Prediabetes 03/23/2013  . Weakness 03/22/2013  . Alcoholism (HCC) 03/22/2013  . DM, UNCOMPLICATED, TYPE II 05/05/2006  . HYPERLIPIDEMIA 05/05/2006  . ANEMIA 05/05/2006  . HYPERTENSION 05/05/2006    Past Surgical History:  Procedure Laterality Date  . NO PAST SURGERIES         Home Medications    Prior to Admission medications   Medication Sig Start Date End Date Taking? Authorizing Provider  acetaminophen (TYLENOL) 500 MG tablet Take 1,000 mg by mouth every 6 (six) hours as needed for headache (pain).   Yes [provider]  folic acid (FOLVITE) 1 MG tablet Take 1 tablet (1 mg total) by mouth daily. Patient not taking: Reported on 08/25/2016 03/24/13   Clydia Llano, MD  HYDROcodone-acetaminophen (NORCO) 5-325 MG per tablet Take 1 tablet by mouth every 6 (six) hours as needed for moderate pain. Patient not taking: Reported on  08/25/2016 03/24/13   Clydia Llano, MD  Multiple Vitamin (MULTIVITAMIN WITH MINERALS) TABS tablet Take 1 tablet by mouth daily. Patient not taking: Reported on 08/25/2016 03/24/13   Clydia Llano, MD    Family History Family History  Problem Relation Age of Onset  . Diabetes Mellitus II Other   . CAD Other   . Stroke Neg Hx   . Cancer Neg Hx     Social History Social History  Substance Use Topics  . Smoking status: Current Every Day Smoker    Packs/day: 1.00  . Smokeless tobacco: Never Used  . Alcohol use No     Allergies   Patient has no known allergies.   Review of Systems Review of Systems  Cardiovascular: Negative for chest pain and palpitations.  Gastrointestinal: Negative for abdominal pain, nausea and vomiting.  Musculoskeletal: Negative for back pain and neck pain.  Skin: Negative for wound.  Neurological: Negative for syncope, light-headedness and headaches.       No LOC     Physical Exam Updated Vital Signs BP (!) 161/87   Pulse 76   Temp 98.6 F (37 C) (Oral)   Resp 16   Ht 6' 2.5" (1.892 m)   Wt 65.8 kg (145 lb)   SpO2 97%   BMI 18.37 kg/m   Physical Exam  Constitutional: He appears well-developed and well-nourished.  HENT:  Head: Normocephalic and atraumatic.  Eyes: Conjunctivae are normal.  Neck: Neck supple.  Cardiovascular: Normal rate and regular rhythm.   No murmur heard. Pulmonary/Chest: Effort normal and breath sounds normal. No respiratory distress.  Abdominal: Soft. There is no tenderness.  Musculoskeletal: He exhibits no edema.  Right upper extremity with gross deformity of the proximal upper arm. No open wounds. Tenderness to palpation in this area. No tenderness along the clavicle. No tenderness distal to this area. Neurovascularly intact. No tenderness to palpation along the cervical/thoracic, lumbar spine. No step-offs.  Neurological: He is alert.  Skin: Skin is warm and dry.  Psychiatric: He has a normal mood and affect.    Nursing note and vitals reviewed.    ED Treatments / Results  Labs (all labs ordered are listed, but only abnormal results are displayed) Labs Reviewed - No data to display  EKG  EKG Interpretation None       Radiology Dg Shoulder Right  Result Date: 08/25/2016 CLINICAL DATA:  Fall yesterday with right arm pain, initial encounter EXAM: RIGHT SHOULDER - 2+ VIEW COMPARISON:  06/25/2009 FINDINGS: There is a fracture through the surgical neck with mild impaction at the fracture site. No dislocation is noted. No other focal abnormality is seen. IMPRESSION: Surgical neck fracture of the proximal right humerus. Electronically Signed   By: Alcide CleverMark  Lukens M.D.   On: 08/25/2016 20:32    Procedures Procedures (including critical care time)  Medications Ordered in ED Medications  acetaminophen (TYLENOL) tablet 1,000 mg (1,000 mg Oral Given 08/25/16 2001)  ketorolac (TORADOL) injection 60 mg (60 mg Intramuscular Given 08/25/16 2218)     Initial Impression / Assessment and Plan / ED Course  I have reviewed the triage vital signs and the nursing notes.  Pertinent labs & imaging results that were available during my care of the patient were reviewed by me and considered in my medical decision making (see chart for details).     Patient had a mechanical fall resulting in right shoulder pain. X-ray here shows evidence of a surgical neck fracture of the humerus. Place the patient and is sling. Spoke with on-call orthopedics. Agree with leaving the patient in sling and having the patient follow up outpatient. He has no open wounds to indicate an open fracture. He is neurovascularly intact. Given his history of alcohol abuse as well as advanced age, we'll not give him a prescription for stronger pain medications. Encouraged him to use ibuprofen and Tylenol for pain, and to discuss stronger pain medications with his primary care doctor and orthopedic.  Final Clinical Impressions(s) / ED Diagnoses    Final diagnoses:  Closed nondisplaced fracture of surgical neck of right humerus, unspecified fracture morphology, initial encounter    New Prescriptions Discharge Medication List as of 08/25/2016 10:10 PM       Lindalou Hose'Rourke, Ollivander See, MD 08/26/16 [id number]    Cathren LaineSteinl, Kevin, MD 08/28/16 [id number]

## 2016-08-25 NOTE — ED Triage Notes (Signed)
Pt bib [facility] from home d/t shoulder pain he sustained in a fall yesterday.  Per EMS pt states he does not remember why he fell or how he fell however remembers calling for help.  Pt rates pain for EMS 2/10 while still and 7/10 w/ movement.

## 2016-08-25 NOTE — ED Notes (Signed)
Pt continuing to c/o pain, notified Dr. Denton LankSteinl

## 2016-09-07 ENCOUNTER — Emergency Department (HOSPITAL_COMMUNITY): Payer: Medicare Other

## 2016-09-07 ENCOUNTER — Encounter (HOSPITAL_COMMUNITY): Payer: Self-pay | Admitting: Emergency Medicine

## 2016-09-07 ENCOUNTER — Emergency Department (HOSPITAL_COMMUNITY)
Admission: EM | Admit: 2016-09-07 | Discharge: 2016-09-07 | Disposition: A | Discharge status: Home / Self Care | Payer: Medicare Other | Attending: Emergency Medicine | Admitting: Emergency Medicine

## 2016-09-07 DIAGNOSIS — S42211G Unspecified displaced fracture of surgical neck of right humerus, subsequent encounter for fracture with delayed healing: Secondary | ICD-10-CM

## 2016-09-07 DIAGNOSIS — Y929 Unspecified place or not applicable: Secondary | ICD-10-CM | POA: Insufficient documentation

## 2016-09-07 DIAGNOSIS — F17200 Nicotine dependence, unspecified, uncomplicated: Secondary | ICD-10-CM | POA: Diagnosis not present

## 2016-09-07 DIAGNOSIS — E876 Hypokalemia: Secondary | ICD-10-CM | POA: Diagnosis not present

## 2016-09-07 DIAGNOSIS — Y939 Activity, unspecified: Secondary | ICD-10-CM | POA: Insufficient documentation

## 2016-09-07 DIAGNOSIS — I10 Essential (primary) hypertension: Secondary | ICD-10-CM | POA: Insufficient documentation

## 2016-09-07 DIAGNOSIS — E119 Type 2 diabetes mellitus without complications: Secondary | ICD-10-CM | POA: Diagnosis not present

## 2016-09-07 DIAGNOSIS — Y999 Unspecified external cause status: Secondary | ICD-10-CM | POA: Insufficient documentation

## 2016-09-07 DIAGNOSIS — W19XXXA Unspecified fall, initial encounter: Secondary | ICD-10-CM | POA: Diagnosis not present

## 2016-09-07 DIAGNOSIS — S4991XA Unspecified injury of right shoulder and upper arm, initial encounter: Secondary | ICD-10-CM | POA: Diagnosis present

## 2016-09-07 LAB — URINALYSIS, ROUTINE W REFLEX MICROSCOPIC
BILIRUBIN URINE: NEGATIVE
Glucose, UA: NEGATIVE mg/dL
Hgb urine dipstick: NEGATIVE
Ketones, ur: NEGATIVE mg/dL
Leukocytes, UA: NEGATIVE
NITRITE: NEGATIVE
PH: 6 (ref 5.0–8.0)
Protein, ur: NEGATIVE mg/dL
SPECIFIC GRAVITY, URINE: 1.004 — AB (ref 1.005–1.030)

## 2016-09-07 LAB — CBC WITH DIFFERENTIAL/PLATELET
Basophils Absolute: 0 10*3/uL (ref 0.0–0.1)
Basophils Relative: 0 %
EOS ABS: 0.1 10*3/uL (ref 0.0–0.7)
Eosinophils Relative: 1 %
HCT: 37.7 % — ABNORMAL LOW (ref 39.0–52.0)
HEMOGLOBIN: 12.4 g/dL — AB (ref 13.0–17.0)
LYMPHS ABS: 1.9 10*3/uL (ref 0.7–4.0)
LYMPHS PCT: 17 %
MCH: 23.2 pg — AB (ref 26.0–34.0)
MCHC: 32.9 g/dL (ref 30.0–36.0)
MCV: 70.5 fL — AB (ref 78.0–100.0)
MONOS PCT: 8 %
Monocytes Absolute: 0.8 10*3/uL (ref 0.1–1.0)
NEUTROS ABS: 7.9 10*3/uL — AB (ref 1.7–7.7)
NEUTROS PCT: 74 %
Platelets: 359 10*3/uL (ref 150–400)
RBC: 5.35 MIL/uL (ref 4.22–5.81)
RDW: 16.5 % — ABNORMAL HIGH (ref 11.5–15.5)
WBC: 10.7 10*3/uL — ABNORMAL HIGH (ref 4.0–10.5)

## 2016-09-07 LAB — I-STAT CHEM 8, ED
BUN: 8 mg/dL (ref 6–20)
CHLORIDE: 93 mmol/L — AB (ref 101–111)
CREATININE: 0.7 mg/dL (ref 0.61–1.24)
Calcium, Ion: 1.13 mmol/L — ABNORMAL LOW (ref 1.15–1.40)
Glucose, Bld: 160 mg/dL — ABNORMAL HIGH (ref 65–99)
HCT: 42 % (ref 39.0–52.0)
Hemoglobin: 14.3 g/dL (ref 13.0–17.0)
Potassium: 2.8 mmol/L — ABNORMAL LOW (ref 3.5–5.1)
Sodium: 140 mmol/L (ref 135–145)
TCO2: 35 mmol/L (ref 0–100)

## 2016-09-07 MED ORDER — IBUPROFEN 800 MG PO TABS
800.0000 mg | ORAL_TABLET | Freq: Once | ORAL | Status: AC
  Administered 2016-09-07: 800 mg via ORAL
  Filled 2016-09-07: qty 1

## 2016-09-07 MED ORDER — POTASSIUM CHLORIDE CRYS ER 20 MEQ PO TBCR: 20.0000 meq | EXTENDED_RELEASE_TABLET | Freq: Every day | ORAL | 0 refills | Status: DC

## 2016-09-07 MED ORDER — TRAMADOL HCL 50 MG PO TABS: 50.0000 mg | ORAL_TABLET | Freq: Four times a day (QID) | ORAL | 0 refills | Status: DC | PRN

## 2016-09-07 MED ORDER — MORPHINE SULFATE (PF) 2 MG/ML IV SOLN
4.0000 mg | Freq: Once | INTRAVENOUS | Status: AC
  Administered 2016-09-07: 4 mg via INTRAVENOUS
  Filled 2016-09-07: qty 2

## 2016-09-07 MED ORDER — DIPHENHYDRAMINE HCL 50 MG/ML IJ SOLN
25.0000 mg | Freq: Once | INTRAMUSCULAR | Status: AC
  Administered 2016-09-07: 25 mg via INTRAVENOUS
  Filled 2016-09-07: qty 1

## 2016-09-07 NOTE — ED Triage Notes (Signed)
Pt is from home.  He had a fall one week ago.  He was seen at [facility] for shoulder pain.  Pt was instructed to wear a sling.  Family that was supposed to be taking care of him was not keeping sling on him.  HCPOA who is with Pt states that Pt looks malnourished.  Pt is having limited movement in shoulder and is no longer ambulatory with his walker.

## 2016-09-07 NOTE — Progress Notes (Signed)
CSW spoke with patient and patient's daughter at bedside, patient alert but did not talk during assessment. Patient's daughter reported that she is interested in SNF placement because patient is no longer able to ambulate or complete ADLs. Patient's daughter reported that patient was walking with a cane prior to hurting his shoulder. CSW provided patient's daughter with information about local SNFs and transportation options. CSW and patient's daughter discussed patient's insurance, patient's daughter reported that she nor the patient would be able to private pay for SNF. Patient's daughter reported that she has applied for medicaid, CSW encouraged patient's daughter to follow up with DSS to inquire about which type of medicaid and if it will cover SNF. CSW and patient's daughter discussed patient's need for a primary care physician, CSW provided patient's daughter with a list of local primary care physicians and encouraged patient's daughter to make an appointment asap to receive assistance with FL2 for SNF. CSW inquired if patient's daughter required any additional resources, patient's daughter reported none.

## 2016-09-07 NOTE — Discharge Instructions (Signed)
You have been evaluated for your fall. Your right shoulder is broken and appears to be more displaced than before.  Please wear sling immobilizer as recommended.  Please use resources provided to find a facility for further management of your health since you are a fall risk.  Take tramadol as needed for pain. Your potassium level is low, take supplementation as prescribed.

## 2016-09-07 NOTE — ED Notes (Signed)
Bed: [id number] Expected date:  Expected time:  Means of arrival:  Comments: Shoulder pain; fall

## 2016-09-07 NOTE — ED Notes (Signed)
Ortho tech notified for new orders

## 2016-09-07 NOTE — ED Provider Notes (Signed)
[facility]-EMERGENCY DEPT Provider Note   CSN: [id number] Arrival date & time: 09/07/16  1537     History   Chief Complaint Chief Complaint  Patient presents with  . Shoulder Pain    HPI Shaun Pena is a 83 y.o. male.  HPI   83 year old male with history of diabetes, anemia, weakness, alcohol abuse, brought here via EMS from home for evaluation of shoulder pain, but primarily failure to thrive. Patient accompanied by his daughter who is the healthcare power of attorney. Patient had a fall 1 week ago was seen at [facility] for shoulder pain. X-ray at that time revealed a closed fracture of the surgical neck of the right humerus. Since then, he is having difficulty performing his daily activities. He normally uses a cane/walker to walk but now he is mostly bedbound. He does eat much. He report having pain to his right shoulder as well as pain around his left shoulder from the sling strap. He also fell twice since the last injury and skinned his right lower extremity. Daughter noticed bed sores. She did give patient ibuprofen prior to arrival for his pain. He denies any significant pain at this time. Denies having headache, neck pain, chest pain, trouble breathing, abdominal pain, or dysuria. Patient lives at home with his Doreene Adas, and his brother. Daughter states, he isn't receiving adequate care at home. She does not live with him.Marland Kitchen She would like for him to be careful in a facility. Patient does not have a PCP. Denies any recent alcohol use.   Past Medical History:  Diagnosis Date  . Anemia    microcytic with MCV = 72 and Hg/Hct WNL  . Diabetes mellitus   . Hyperlipidemia     Patient Active Problem List   Diagnosis Date Noted  . Proximal humerus fracture 03/24/2013  . CAP (community acquired pneumonia) 03/24/2013  . Protein-calorie malnutrition, severe (HCC) 03/23/2013  . Prediabetes 03/23/2013  . Weakness 03/22/2013  . Alcoholism (HCC) 03/22/2013  . DM, UNCOMPLICATED, TYPE II  05/05/2006  . HYPERLIPIDEMIA 05/05/2006  . ANEMIA 05/05/2006  . HYPERTENSION 05/05/2006    Past Surgical History:  Procedure Laterality Date  . NO PAST SURGERIES         Home Medications    Prior to Admission medications   Medication Sig Start Date End Date Taking? Authorizing Provider  acetaminophen (TYLENOL) 500 MG tablet Take 1,000 mg by mouth every 6 (six) hours as needed for headache (pain).    [provider]  folic acid (FOLVITE) 1 MG tablet Take 1 tablet (1 mg total) by mouth daily. Patient not taking: Reported on 08/25/2016 03/24/13   Clydia Llano, MD  HYDROcodone-acetaminophen (NORCO) 5-325 MG per tablet Take 1 tablet by mouth every 6 (six) hours as needed for moderate pain. Patient not taking: Reported on 08/25/2016 03/24/13   Clydia Llano, MD  Multiple Vitamin (MULTIVITAMIN WITH MINERALS) TABS tablet Take 1 tablet by mouth daily. Patient not taking: Reported on 08/25/2016 03/24/13   Clydia Llano, MD    Family History Family History  Problem Relation Age of Onset  . Diabetes Mellitus II Other   . CAD Other   . Stroke Neg Hx   . Cancer Neg Hx     Social History Social History  Substance Use Topics  . Smoking status: Current Every Day Smoker    Packs/day: 1.00  . Smokeless tobacco: Never Used  . Alcohol use No     Allergies   Patient has no known allergies.  Review of Systems Review of Systems  All other systems reviewed and are negative.    Physical Exam Updated Vital Signs BP 137/68 (BP Location: Left Arm)   Pulse 83   Temp 98.8 F (37.1 C) (Oral)   Resp 18   Ht 6' 2.5" (1.892 m)   Wt 65.8 kg (145 lb)   SpO2 98%   BMI 18.37 kg/m   Physical Exam  Constitutional:  Cachectic appearing male appears to be in no acute discomfort. Patient wearing a homemade sling.  HENT:  No scalp tenderness.  Eyes: EOM are normal. Pupils are equal, round, and reactive to light.  Neck: Normal range of motion.  Cardiovascular: Normal rate and  regular rhythm.   Pulmonary/Chest: Effort normal and breath sounds normal.  Abdominal: Soft. He exhibits no distension. There is no tenderness.  Musculoskeletal: He exhibits tenderness (Right shoulder: Diffuse tenderness to the shoulder with associated ecchymosis from previous injury. Decreased range of motion secondary to pain.).  Mild tenderness to left trapezius muscle where the sling stripe is located. No abrasion  Neurological:  Alert to self and situation, unable to tell location or time.  Skin:  RLE: Scab noted to the anterior tib-fib region with minimal tenderness, no new gross deformity. Minimal tenderness.   Pressure sores noted to bilateral latera hip.   Nursing note and vitals reviewed.    ED Treatments / Results  Labs (all labs ordered are listed, but only abnormal results are displayed) Labs Reviewed  URINALYSIS, ROUTINE W REFLEX MICROSCOPIC - Abnormal; Notable for the following:       Result Value   Specific Gravity, Urine 1.004 (*)    All other components within normal limits  CBC WITH DIFFERENTIAL/PLATELET - Abnormal; Notable for the following:    WBC 10.7 (*)    Hemoglobin 12.4 (*)    HCT 37.7 (*)    MCV 70.5 (*)    MCH 23.2 (*)    RDW 16.5 (*)    Neutro Abs 7.9 (*)    All other components within normal limits  I-STAT CHEM 8, ED - Abnormal; Notable for the following:    Potassium 2.8 (*)    Chloride 93 (*)    Glucose, Bld 160 (*)    Calcium, Ion 1.13 (*)    All other components within normal limits    EKG  EKG Interpretation  Date/Time:  Saturday September 07 2016 18:17:00 EDT Ventricular Rate:  81 PR Interval:    QRS Duration: 109 QT Interval:  401 QTC Calculation: 454 R Axis:   -7 Text Interpretation:  Sinus rhythm Paired ventricular premature complexes Probable LVH with secondary repol abnrm Anterior Q waves, possibly due to LVH No significant change was found Confirmed by Paula Libra ([id number]) on 09/07/2016 6:41:14 PM     ED ECG  REPORT    Radiology Dg Chest 2 View  Result Date: 09/07/2016 CLINICAL DATA:  Cough.  Fall 1 week ago.  Diabetes.  Smoker. EXAM: CHEST  2 VIEW COMPARISON:  03/22/2013 FINDINGS: Two frontal and 1 lateral radiograph. The lateral view is suboptimal secondary to positioning and resultant artifact. Midline trachea. Normal heart size. Tortuous thoracic aorta. No pleural effusion or pneumothorax. Hyperinflation. Clear lungs. Proximal right humerus fracture. Probable remote proximal left humerus fracture with nonunion. IMPRESSION: Hyperinflation, without acute disease. Bilateral proximal humerus fractures. Left-sided fracture is likely remote. Electronically Signed   By: Jeronimo Greaves M.D.   On: 09/07/2016 17:50   Dg Shoulder Right  Result Date: 09/07/2016 CLINICAL DATA:  Fall 1 week ago, shoulder fracture EXAM: RIGHT SHOULDER - 2+ VIEW COMPARISON:  08/25/2016 FINDINGS: Comminuted fracture of the right humeral neck, now with approximately 1/2 shaft width medial displacement. Mild degenerative changes of the glenohumeral and acromioclavicular joints. Visualized soft tissues are within normal limits. Visualized right lung is clear. IMPRESSION: 1/2 shaft width medial displacement of the known comminuted right humeral neck fracture. Electronically Signed   By: Charline Bills M.D.   On: 09/07/2016 17:48    Procedures Procedures (including critical care time)  Medications Ordered in ED Medications  morphine 2 MG/ML injection 4 mg (4 mg Intravenous Given 09/07/16 1905)  diphenhydrAMINE (BENADRYL) injection 25 mg (25 mg Intravenous Given 09/07/16 1925)     Initial Impression / Assessment and Plan / ED Course  I have reviewed the triage vital signs and the nursing notes.  Pertinent labs & imaging results that were available during my care of the patient were reviewed by me and considered in my medical decision making (see chart for details).     BP (!) 182/86   Pulse 79   Temp 98.8 F (37.1 C) (Oral)    Resp (!) 26   Ht 6' 2.5" (1.892 m)   Wt 65.8 kg (145 lb)   SpO2 99%   BMI 18.37 kg/m  The patient was noted to be hypertensive today in the emergency department. I have spoken with the patient regarding hypertension and the need for improved management. I instructed the patient to followup with the Primary care doctor within 4 days to improve the management of the patient's hypertension. I also counseled the patient regarding the signs and symptoms which would require an emergent visit to an emergency department for hypertensive urgency and/or hypertensive emergency. The patient understood the need for improved hypertensive management.   Final Clinical Impressions(s) / ED Diagnoses   Final diagnoses:  Closed displaced fracture of surgical neck of right humerus with delayed healing, unspecified fracture morphology, subsequent encounter  Hypokalemia    New Prescriptions New Prescriptions   POTASSIUM CHLORIDE SA (K-DUR,KLOR-CON) 20 MEQ TABLET    Take 1 tablet (20 mEq total) by mouth daily.   TRAMADOL (ULTRAM) 50 MG TABLET    Take 1 tablet (50 mg total) by mouth every 6 (six) hours as needed for moderate pain or severe pain.   4:48 PM Pt with failure to thrives/p R shoulder fracture 1 week ago. He will need rehabs and nursing care to assist with ADL.  Appreciate consultation from Reyno, our Social Worker who will offer resources for [employer].  Will obtain basic labs, EKG, reimage R shoulder.  Pt report occasional productive cough x 1 week, CXR order as well as UA.  Care discussed with Dr. Read Drivers.   6:29 PM Lab is remarkable for hypokalemia with a potassium of 2.8. Likely secondary to poor by mouth intake. Will obtain EKG, and will give supplementation. Chest x-ray without signs of infection. Right shoulder repeat x-ray demonstrate one half shaft width medial displacement of a known comminuted right humeral neck fracture. Shoulder immobilizer have been ordered, will consult orthopedic for  this injury.  7:12 PM Appreciate consultation with oncall orthopedist DR. Lajoyce Corners who agrees pt should be on a shoulder immobilizer. Pt can f/u with Dr. Yevette Edwards outpt for further care.    8:17 PM UA with out signs of infection.  CXR unremarkable. I tried reaching out to our case manager to help set up home health need however unable to contact with anyone due to this  being the weekend. Pt did talk to our social worker who provided additional outpt resources.  Recommend outpt management of his condition.    Fayrene Helperran, Keontay Vora, PA-C 09/07/16 2018    Molpus, Jonny RuizJohn, MD 09/07/16 2239

## 2016-09-17 ENCOUNTER — Inpatient Hospital Stay (HOSPITAL_COMMUNITY)
Admission: EM | Admit: 2016-09-17 | Discharge: 2016-09-22 | DRG: 682 | Disposition: A | Discharge status: Home / Self Care | Payer: Medicare Other | Attending: Family Medicine | Admitting: Family Medicine

## 2016-09-17 ENCOUNTER — Emergency Department (HOSPITAL_COMMUNITY): Payer: Medicare Other

## 2016-09-17 DIAGNOSIS — Z66 Do not resuscitate: Secondary | ICD-10-CM | POA: Diagnosis present

## 2016-09-17 DIAGNOSIS — D509 Iron deficiency anemia, unspecified: Secondary | ICD-10-CM | POA: Diagnosis present

## 2016-09-17 DIAGNOSIS — E870 Hyperosmolality and hypernatremia: Secondary | ICD-10-CM | POA: Diagnosis present

## 2016-09-17 DIAGNOSIS — N170 Acute kidney failure with tubular necrosis: Secondary | ICD-10-CM | POA: Diagnosis not present

## 2016-09-17 DIAGNOSIS — L98429 Non-pressure chronic ulcer of back with unspecified severity: Secondary | ICD-10-CM | POA: Diagnosis present

## 2016-09-17 DIAGNOSIS — Z79899 Other long term (current) drug therapy: Secondary | ICD-10-CM

## 2016-09-17 DIAGNOSIS — N179 Acute kidney failure, unspecified: Secondary | ICD-10-CM | POA: Diagnosis present

## 2016-09-17 DIAGNOSIS — R509 Fever, unspecified: Secondary | ICD-10-CM

## 2016-09-17 DIAGNOSIS — E785 Hyperlipidemia, unspecified: Secondary | ICD-10-CM | POA: Diagnosis present

## 2016-09-17 DIAGNOSIS — E11622 Type 2 diabetes mellitus with other skin ulcer: Secondary | ICD-10-CM | POA: Diagnosis present

## 2016-09-17 DIAGNOSIS — R1310 Dysphagia, unspecified: Secondary | ICD-10-CM

## 2016-09-17 DIAGNOSIS — F17210 Nicotine dependence, cigarettes, uncomplicated: Secondary | ICD-10-CM | POA: Diagnosis present

## 2016-09-17 DIAGNOSIS — L89209 Pressure ulcer of unspecified hip, unspecified stage: Secondary | ICD-10-CM | POA: Diagnosis present

## 2016-09-17 DIAGNOSIS — W1830XA Fall on same level, unspecified, initial encounter: Secondary | ICD-10-CM | POA: Diagnosis present

## 2016-09-17 DIAGNOSIS — F0390 Unspecified dementia without behavioral disturbance: Secondary | ICD-10-CM | POA: Diagnosis present

## 2016-09-17 DIAGNOSIS — L8990 Pressure ulcer of unspecified site, unspecified stage: Secondary | ICD-10-CM | POA: Insufficient documentation

## 2016-09-17 DIAGNOSIS — E119 Type 2 diabetes mellitus without complications: Secondary | ICD-10-CM

## 2016-09-17 DIAGNOSIS — S42209A Unspecified fracture of upper end of unspecified humerus, initial encounter for closed fracture: Secondary | ICD-10-CM | POA: Diagnosis present

## 2016-09-17 DIAGNOSIS — S42211A Unspecified displaced fracture of surgical neck of right humerus, initial encounter for closed fracture: Secondary | ICD-10-CM | POA: Diagnosis present

## 2016-09-17 DIAGNOSIS — F1020 Alcohol dependence, uncomplicated: Secondary | ICD-10-CM | POA: Diagnosis present

## 2016-09-17 DIAGNOSIS — Z515 Encounter for palliative care: Secondary | ICD-10-CM | POA: Diagnosis present

## 2016-09-17 DIAGNOSIS — Z23 Encounter for immunization: Secondary | ICD-10-CM

## 2016-09-17 DIAGNOSIS — E43 Unspecified severe protein-calorie malnutrition: Secondary | ICD-10-CM | POA: Diagnosis present

## 2016-09-17 LAB — COMPREHENSIVE METABOLIC PANEL
ALBUMIN: 3.1 g/dL — AB (ref 3.5–5.0)
ALT: 18 U/L (ref 17–63)
AST: 29 U/L (ref 15–41)
Alkaline Phosphatase: 51 U/L (ref 38–126)
Anion gap: 27 — ABNORMAL HIGH (ref 5–15)
BUN: 162 mg/dL — AB (ref 6–20)
CHLORIDE: 93 mmol/L — AB (ref 101–111)
CO2: 19 mmol/L — AB (ref 22–32)
CREATININE: 14.73 mg/dL — AB (ref 0.61–1.24)
Calcium: 9.1 mg/dL (ref 8.9–10.3)
GFR calc Af Amer: 3 mL/min — ABNORMAL LOW (ref >60)
GFR, EST NON AFRICAN AMERICAN: 3 mL/min — AB (ref >60)
Glucose, Bld: 193 mg/dL — ABNORMAL HIGH (ref 65–99)
POTASSIUM: 4.6 mmol/L (ref 3.5–5.1)
Sodium: 139 mmol/L (ref 135–145)
TOTAL PROTEIN: 7.2 g/dL (ref 6.5–8.1)
Total Bilirubin: 1.4 mg/dL — ABNORMAL HIGH (ref 0.3–1.2)

## 2016-09-17 LAB — CBC WITH DIFFERENTIAL/PLATELET
BASOS ABS: 0 10*3/uL (ref 0.0–0.1)
Basophils Relative: 0 %
EOS PCT: 0 %
Eosinophils Absolute: 0 10*3/uL (ref 0.0–0.7)
HCT: 32.8 % — ABNORMAL LOW (ref 39.0–52.0)
Hemoglobin: 11.4 g/dL — ABNORMAL LOW (ref 13.0–17.0)
LYMPHS ABS: 1.3 10*3/uL (ref 0.7–4.0)
Lymphocytes Relative: 7 %
MCH: 22.8 pg — ABNORMAL LOW (ref 26.0–34.0)
MCHC: 34.8 g/dL (ref 30.0–36.0)
MCV: 65.6 fL — AB (ref 78.0–100.0)
MONO ABS: 1.3 10*3/uL — AB (ref 0.1–1.0)
MONOS PCT: 7 %
NEUTROS PCT: 86 %
Neutro Abs: 16.2 10*3/uL — ABNORMAL HIGH (ref 1.7–7.7)
PLATELETS: 167 10*3/uL (ref 150–400)
RBC: 5 MIL/uL (ref 4.22–5.81)
RDW: 16 % — AB (ref 11.5–15.5)
WBC: 18.8 10*3/uL — AB (ref 4.0–10.5)

## 2016-09-17 LAB — CK: CK TOTAL: 822 U/L — AB (ref 49–397)

## 2016-09-17 MED ORDER — THIAMINE HCL 100 MG/ML IJ SOLN
100.0000 mg | Freq: Once | INTRAMUSCULAR | Status: AC
Start: 2016-09-17 — End: 2016-09-17
  Administered 2016-09-17: 100 mg via INTRAVENOUS
  Filled 2016-09-17: qty 2

## 2016-09-17 MED ORDER — SODIUM CHLORIDE 0.9 % IV BOLUS (SEPSIS)
1000.0000 mL | Freq: Once | INTRAVENOUS | Status: AC
  Administered 2016-09-17: 1000 mL via INTRAVENOUS

## 2016-09-17 MED ORDER — TETANUS-DIPHTH-ACELL PERTUSSIS 5-2.5-18.5 LF-MCG/0.5 IM SUSP
0.5000 mL | Freq: Once | INTRAMUSCULAR | Status: AC
  Administered 2016-09-17: 0.5 mL via INTRAMUSCULAR
  Filled 2016-09-17: qty 0.5

## 2016-09-17 NOTE — ED Provider Notes (Signed)
[facility]-EMERGENCY DEPT Provider Note   CSN: [id number] Arrival date & time: 09/17/16  1827     History   Chief Complaint Chief Complaint  Patient presents with  . Wound Check    skin ulcers  Level V caveat patient is extremely poor historian. History is obtained from patient's daughters who accompany him  HPI Shaun Pena is a 83 y.o. male.History is obtained from patient's daughters who accompany him. Patient has been unable to walk since he fell approximately 3 weeks ago suffering a fracture to his right shoulder. He has been bedbound for the past 3 weeks. He he is also not eating and drinking for the past 2 weeks or so. Daughters report that he's lost a significant amount of weight though they don't know how much. Patient denies pain anywhere. He's developed decubitus ulcers of his left hip and presacral area. And has lost He has no  health care presently. His daughter's report that he been cared for by a family member who had not let physicians into the home. He had been seen by physicians making house calls.Marland Kitchen  HPI  Past Medical History:  Diagnosis Date  . Anemia    microcytic with MCV = 72 and Hg/Hct WNL  . Diabetes mellitus   . Hyperlipidemia     Patient Active Problem List   Diagnosis Date Noted  . Proximal humerus fracture 03/24/2013  . CAP (community acquired pneumonia) 03/24/2013  . Protein-calorie malnutrition, severe (HCC) 03/23/2013  . Prediabetes 03/23/2013  . Weakness 03/22/2013  . Alcoholism (HCC) 03/22/2013  . DM, UNCOMPLICATED, TYPE II 05/05/2006  . HYPERLIPIDEMIA 05/05/2006  . ANEMIA 05/05/2006  . HYPERTENSION 05/05/2006    Past Surgical History:  Procedure Laterality Date  . NO PAST SURGERIES         Home Medications    Prior to Admission medications   Medication Sig Start Date End Date Taking? Authorizing Provider  acetaminophen (TYLENOL) 650 MG CR tablet Take 1,950 mg by mouth daily as needed for pain.    Yes [provider]    ibuprofen (ADVIL,MOTRIN) 200 MG tablet Take 400 mg by mouth every 6 (six) hours as needed for mild pain or moderate pain.   Yes [provider]  folic acid (FOLVITE) 1 MG tablet Take 1 tablet (1 mg total) by mouth daily. Patient not taking: Reported on 08/25/2016 03/24/13   Clydia Llano, MD  HYDROcodone-acetaminophen (NORCO) 5-325 MG per tablet Take 1 tablet by mouth every 6 (six) hours as needed for moderate pain. Patient not taking: Reported on 08/25/2016 03/24/13   Clydia Llano, MD  Multiple Vitamin (MULTIVITAMIN WITH MINERALS) TABS tablet Take 1 tablet by mouth daily. Patient not taking: Reported on 08/25/2016 03/24/13   Clydia Llano, MD  potassium chloride SA (K-DUR,KLOR-CON) 20 MEQ tablet Take 1 tablet (20 mEq total) by mouth daily. Patient not taking: Reported on 09/17/2016 09/07/16   Fayrene Helper, PA-C  traMADol (ULTRAM) 50 MG tablet Take 1 tablet (50 mg total) by mouth every 6 (six) hours as needed for moderate pain or severe pain. Patient not taking: Reported on 09/17/2016 09/07/16   Fayrene Helper, PA-C    Family History Family History  Problem Relation Age of Onset  . Diabetes Mellitus II Other   . CAD Other   . Stroke Neg Hx   . Cancer Neg Hx     Social History Social History  Substance Use Topics  . Smoking status: Current Every Day Smoker    Packs/day: 1.00  .  Smokeless tobacco: Never Used  . Alcohol use No   Former heavy drinker  Allergies   Patient has no known allergies.   Review of Systems Review of Systems  Unable to perform ROS: Other  Constitutional: Positive for appetite change and unexpected weight change.  Musculoskeletal: Positive for arthralgias and gait problem.       Bedbound for 3 weeks  Skin: Positive for wound.       Decubitus ulcers     Physical Exam Updated Vital Signs BP 130/75   Pulse (!) 101   Temp 98.9 F (37.2 C) (Rectal)   SpO2 96%   Physical Exam  Constitutional: He appears well-developed and well-nourished.   Chronically ill-appearing  HENT:  Head: Normocephalic and atraumatic.  Mucous membranes dry  Eyes: Conjunctivae are normal. Pupils are equal, round, and reactive to light.  Neck: Neck supple. No tracheal deviation present. No thyromegaly present.  Cardiovascular: Normal rate and regular rhythm.   No murmur heard. Pulmonary/Chest: Effort normal and breath sounds normal.  Abdominal: Soft. Bowel sounds are normal. He exhibits no distension. There is no tenderness.  Genitourinary:  Genitourinary Comments: uncircumcized  Musculoskeletal: Normal range of motion. He exhibits no edema or tenderness.  Neurological: He is alert. Coordination normal.  Motor strength of bilateral lower extremities 2/5 motor strength bilateral upper extremities 4/5  Skin: Skin is warm and dry. No rash noted.  Baseball sized scabbed wound to right hip. 3 cm stage III decubitus ulcer to presacral area. 2 cm scabbed lesion to right wrist poor turgor  Psychiatric: He has a normal mood and affect.  Nursing note and vitals reviewed.    ED Treatments / Results  Labs (all labs ordered are listed, but only abnormal results are displayed) Labs Reviewed  CBC WITH DIFFERENTIAL/PLATELET  COMPREHENSIVE METABOLIC PANEL  URINALYSIS, ROUTINE W REFLEX MICROSCOPIC  CK    EKG  EKG Interpretation None       Radiology No results found.  Procedures Procedures (including critical care time)  Medications Ordered in ED Medications  sodium chloride 0.9 % bolus 1,000 mL (not administered)  thiamine (B-1) injection 100 mg (not administered)  Tdap (BOOSTRIX) injection 0.5 mL (not administered)    X-rays viewed by me Results for orders placed or performed during the hospital encounter of 09/17/16  CBC with Differential/Platelet  Result Value Ref Range   WBC 18.8 (H) 4.0 - 10.5 K/uL   RBC 5.00 4.22 - 5.81 MIL/uL   Hemoglobin 11.4 (L) 13.0 - 17.0 g/dL   HCT 16.1 (L) 09.6 - 04.5 %   MCV 65.6 (L) 78.0 - 100.0 fL   MCH  22.8 (L) 26.0 - 34.0 pg   MCHC 34.8 30.0 - 36.0 g/dL   RDW 40.9 (H) 81.1 - 91.4 %   Platelets 167 150 - 400 K/uL   Neutrophils Relative % 86 %   Lymphocytes Relative 7 %   Monocytes Relative 7 %   Eosinophils Relative 0 %   Basophils Relative 0 %   Neutro Abs 16.2 (H) 1.7 - 7.7 K/uL   Lymphs Abs 1.3 0.7 - 4.0 K/uL   Monocytes Absolute 1.3 (H) 0.1 - 1.0 K/uL   Eosinophils Absolute 0.0 0.0 - 0.7 K/uL   Basophils Absolute 0.0 0.0 - 0.1 K/uL   Smear Review MORPHOLOGY UNREMARKABLE   Comprehensive metabolic panel  Result Value Ref Range   Sodium 139 135 - 145 mmol/L   Potassium 4.6 3.5 - 5.1 mmol/L   Chloride 93 (L) 101 - 111  mmol/L   CO2 19 (L) 22 - 32 mmol/L   Glucose, Bld 193 (H) 65 - 99 mg/dL   BUN 295162 (H) 6 - 20 mg/dL   Creatinine, Ser 62.1314.73 (H) 0.61 - 1.24 mg/dL   Calcium 9.1 8.9 - 08.610.3 mg/dL   Total Protein 7.2 6.5 - 8.1 g/dL   Albumin 3.1 (L) 3.5 - 5.0 g/dL   AST 29 15 - 41 U/L   ALT 18 17 - 63 U/L   Alkaline Phosphatase 51 38 - 126 U/L   Total Bilirubin 1.4 (H) 0.3 - 1.2 mg/dL   GFR calc non Af Amer 3 (L) >60 mL/min   GFR calc Af Amer 3 (L) >60 mL/min   Anion gap 27 (H) 5 - 15  CK  Result Value Ref Range   Total CK 822 (H) 49 - 397 U/L   Dg Chest 2 View  Result Date: 09/07/2016 CLINICAL DATA:  Cough.  Fall 1 week ago.  Diabetes.  Smoker. EXAM: CHEST  2 VIEW COMPARISON:  03/22/2013 FINDINGS: Two frontal and 1 lateral radiograph. The lateral view is suboptimal secondary to positioning and resultant artifact. Midline trachea. Normal heart size. Tortuous thoracic aorta. No pleural effusion or pneumothorax. Hyperinflation. Clear lungs. Proximal right humerus fracture. Probable remote proximal left humerus fracture with nonunion. IMPRESSION: Hyperinflation, without acute disease. Bilateral proximal humerus fractures. Left-sided fracture is likely remote. Electronically Signed   By: Jeronimo GreavesKyle  Talbot M.D.   On: 09/07/2016 17:50   Dg Shoulder Right  Result Date: 09/17/2016 CLINICAL  DATA:  83 y/o  M; right shoulder pain. EXAM: RIGHT SHOULDER - 2+ VIEW COMPARISON:  09/07/2016 right shoulder radiographs. FINDINGS: Mildly comminuted humeral neck fracture with approximately 1/2 shaft's width medial displacement of the shaft relative to the head unchanged from prior radiographs. No shoulder joint dislocation. IMPRESSION: Mildly displaced and comminuted right humeral neck fracture grossly unchanged from prior radiographs. No joint dislocation. Electronically Signed   By: Mitzi HansenLance  Furusawa-Stratton M.D.   On: 09/17/2016 22:34   Dg Shoulder Right  Result Date: 09/07/2016 CLINICAL DATA:  Fall 1 week ago, shoulder fracture EXAM: RIGHT SHOULDER - 2+ VIEW COMPARISON:  08/25/2016 FINDINGS: Comminuted fracture of the right humeral neck, now with approximately 1/2 shaft width medial displacement. Mild degenerative changes of the glenohumeral and acromioclavicular joints. Visualized soft tissues are within normal limits. Visualized right lung is clear. IMPRESSION: 1/2 shaft width medial displacement of the known comminuted right humeral neck fracture. Electronically Signed   By: Charline BillsSriyesh  Krishnan M.D.   On: 09/07/2016 17:48   Dg Shoulder Right  Result Date: 08/25/2016 CLINICAL DATA:  Fall yesterday with right arm pain, initial encounter EXAM: RIGHT SHOULDER - 2+ VIEW COMPARISON:  06/25/2009 FINDINGS: There is a fracture through the surgical neck with mild impaction at the fracture site. No dislocation is noted. No other focal abnormality is seen. IMPRESSION: Surgical neck fracture of the proximal right humerus. Electronically Signed   By: Alcide CleverMark  Lukens M.D.   On: 08/25/2016 20:32   Ct Head Wo Contrast  Result Date: 09/17/2016 CLINICAL DATA:  Ataxia. EXAM: CT HEAD WITHOUT CONTRAST TECHNIQUE: Contiguous axial images were obtained from the base of the skull through the vertex without intravenous contrast. COMPARISON:  Head CT 03/09/2013 FINDINGS: Brain: No evidence of acute infarction, hemorrhage,  hydrocephalus, extra-axial collection or mass lesion/mass effect. Stable generalized cerebral and cerebellar atrophy. Stable chronic small vessel ischemia. Vascular: Atherosclerosis of skullbase vasculature without hyperdense vessel or abnormal calcification. Skull: No skull fracture or focal lesion.  Sinuses/Orbits: Paranasal sinuses and mastoid air cells are clear. The visualized orbits are unremarkable. Other: None. IMPRESSION: No acute intracranial abnormality. Stable atrophy and chronic small vessel ischemia. Electronically Signed   By: Rubye Oaks M.D.   On: 09/17/2016 22:46   Initial Impression / Assessment and Plan / ED Course  I have reviewed the triage vital signs and the nursing notes.  Pertinent labs & imaging results that were available during my care of the patient were reviewed by me and considered in my medical decision making (see chart for details).     Patient will require aggressive intravenous hydration. Department of social service called, as patient felt to be neglected by me, by nursing staff and by his daughters who accompany him tonight. Dr. Maryfrances Bunnell consulted and will arrange for admission Final Clinical Impressions(s) / ED Diagnoses  Diagnosis #1 acute renal failure #2 dehydration #3 paraplegia #4 decubitus ulcers # 5 hyperglycemia #6 rhabdomyolysis Final diagnoses:  None  CRITICAL CARE Performed by: Doug Sou Total critical care time: 40 minutes Critical care time was exclusive of separately billable procedures and treating other patients. Critical care was necessary to treat or prevent imminent or life-threatening deterioration. Critical care was time spent personally by me on the following activities: development of treatment plan with patient and/or surrogate as well as nursing, discussions with consultants, evaluation of patient's response to treatment, examination of patient, obtaining history from patient or surrogate, ordering and performing  treatments and interventions, ordering and review of laboratory studies, ordering and review of radiographic studies, pulse oximetry and re-evaluation of patient's condition.  New Prescriptions New Prescriptions   No medications on file     Doug Sou, MD 09/18/16 [phone]

## 2016-09-17 NOTE — ED Notes (Signed)
Attempted IV x 1 unsuccessful.

## 2016-09-17 NOTE — ED Triage Notes (Addendum)
Pt from home with c/o bleeding skin ulcers on right buttock cheek and is about palm sized. Pt denies pain and has no alteration in mental status per EMS. Pt has a low grade fever of 100.5 for EMS but is afebrile at time of assessment. Per daughter (POA) wounds have been present x 1 week but began bleeding today. Pt was slightly hyperglycemic for EMS, but daughter states he was told to stop taking meds for DM a few years ago. Pt is tachypnic at time of assessment   Pt has broken rt shoulder from previous fall  Pt

## 2016-09-17 NOTE — ED Notes (Signed)
Attempted to In and out x2 with no success

## 2016-09-17 NOTE — ED Notes (Signed)
Bed: [facility]08 Expected date:  Expected time:  Means of arrival:  Comments: EMS-decubs

## 2016-09-18 ENCOUNTER — Encounter (HOSPITAL_COMMUNITY): Payer: Self-pay | Admitting: Family Medicine

## 2016-09-18 DIAGNOSIS — N179 Acute kidney failure, unspecified: Secondary | ICD-10-CM | POA: Diagnosis not present

## 2016-09-18 DIAGNOSIS — L89150 Pressure ulcer of sacral region, unstageable: Secondary | ICD-10-CM | POA: Diagnosis not present

## 2016-09-18 DIAGNOSIS — E11622 Type 2 diabetes mellitus with other skin ulcer: Secondary | ICD-10-CM | POA: Diagnosis present

## 2016-09-18 DIAGNOSIS — E43 Unspecified severe protein-calorie malnutrition: Secondary | ICD-10-CM | POA: Diagnosis present

## 2016-09-18 DIAGNOSIS — N170 Acute kidney failure with tubular necrosis: Secondary | ICD-10-CM | POA: Diagnosis present

## 2016-09-18 DIAGNOSIS — L98429 Non-pressure chronic ulcer of back with unspecified severity: Secondary | ICD-10-CM | POA: Diagnosis present

## 2016-09-18 DIAGNOSIS — E785 Hyperlipidemia, unspecified: Secondary | ICD-10-CM | POA: Diagnosis present

## 2016-09-18 DIAGNOSIS — F0390 Unspecified dementia without behavioral disturbance: Secondary | ICD-10-CM | POA: Diagnosis present

## 2016-09-18 DIAGNOSIS — F17210 Nicotine dependence, cigarettes, uncomplicated: Secondary | ICD-10-CM | POA: Diagnosis present

## 2016-09-18 DIAGNOSIS — Z515 Encounter for palliative care: Secondary | ICD-10-CM | POA: Diagnosis not present

## 2016-09-18 DIAGNOSIS — Z79899 Other long term (current) drug therapy: Secondary | ICD-10-CM | POA: Diagnosis not present

## 2016-09-18 DIAGNOSIS — R1310 Dysphagia, unspecified: Secondary | ICD-10-CM | POA: Diagnosis not present

## 2016-09-18 DIAGNOSIS — Z66 Do not resuscitate: Secondary | ICD-10-CM | POA: Diagnosis present

## 2016-09-18 DIAGNOSIS — Z7189 Other specified counseling: Secondary | ICD-10-CM | POA: Diagnosis not present

## 2016-09-18 DIAGNOSIS — S42211A Unspecified displaced fracture of surgical neck of right humerus, initial encounter for closed fracture: Secondary | ICD-10-CM | POA: Diagnosis present

## 2016-09-18 DIAGNOSIS — S42291D Other displaced fracture of upper end of right humerus, subsequent encounter for fracture with routine healing: Secondary | ICD-10-CM | POA: Diagnosis not present

## 2016-09-18 DIAGNOSIS — L8990 Pressure ulcer of unspecified site, unspecified stage: Secondary | ICD-10-CM | POA: Insufficient documentation

## 2016-09-18 DIAGNOSIS — D509 Iron deficiency anemia, unspecified: Secondary | ICD-10-CM | POA: Diagnosis present

## 2016-09-18 DIAGNOSIS — Z23 Encounter for immunization: Secondary | ICD-10-CM | POA: Diagnosis present

## 2016-09-18 DIAGNOSIS — E119 Type 2 diabetes mellitus without complications: Secondary | ICD-10-CM | POA: Diagnosis not present

## 2016-09-18 DIAGNOSIS — F1020 Alcohol dependence, uncomplicated: Secondary | ICD-10-CM | POA: Diagnosis present

## 2016-09-18 DIAGNOSIS — L89209 Pressure ulcer of unspecified hip, unspecified stage: Secondary | ICD-10-CM | POA: Diagnosis present

## 2016-09-18 DIAGNOSIS — W1830XA Fall on same level, unspecified, initial encounter: Secondary | ICD-10-CM | POA: Diagnosis not present

## 2016-09-18 DIAGNOSIS — E870 Hyperosmolality and hypernatremia: Secondary | ICD-10-CM | POA: Diagnosis present

## 2016-09-18 LAB — COMPREHENSIVE METABOLIC PANEL
ALK PHOS: 45 U/L (ref 38–126)
ALT: 15 U/L — ABNORMAL LOW (ref 17–63)
AST: 24 U/L (ref 15–41)
Albumin: 2.8 g/dL — ABNORMAL LOW (ref 3.5–5.0)
Anion gap: 24 — ABNORMAL HIGH (ref 5–15)
BILIRUBIN TOTAL: 1.1 mg/dL (ref 0.3–1.2)
BUN: 168 mg/dL — AB (ref 6–20)
CO2: 21 mmol/L — ABNORMAL LOW (ref 22–32)
Calcium: 8.9 mg/dL (ref 8.9–10.3)
Chloride: 96 mmol/L — ABNORMAL LOW (ref 101–111)
Creatinine, Ser: 14.62 mg/dL — ABNORMAL HIGH (ref 0.61–1.24)
GFR calc Af Amer: 3 mL/min — ABNORMAL LOW (ref >60)
GFR, EST NON AFRICAN AMERICAN: 3 mL/min — AB (ref >60)
Glucose, Bld: 147 mg/dL — ABNORMAL HIGH (ref 65–99)
POTASSIUM: 4.6 mmol/L (ref 3.5–5.1)
Sodium: 141 mmol/L (ref 135–145)
TOTAL PROTEIN: 6.4 g/dL — AB (ref 6.5–8.1)

## 2016-09-18 LAB — URINALYSIS, ROUTINE W REFLEX MICROSCOPIC
BILIRUBIN URINE: NEGATIVE
Glucose, UA: NEGATIVE mg/dL
KETONES UR: NEGATIVE mg/dL
NITRITE: NEGATIVE
Protein, ur: 100 mg/dL — AB
SPECIFIC GRAVITY, URINE: 1.014 (ref 1.005–1.030)
pH: 5 (ref 5.0–8.0)

## 2016-09-18 LAB — GLUCOSE, CAPILLARY
GLUCOSE-CAPILLARY: 150 mg/dL — AB (ref 65–99)
GLUCOSE-CAPILLARY: 112 mg/dL — AB (ref 65–99)

## 2016-09-18 LAB — CBC
HEMATOCRIT: 30.3 % — AB (ref 39.0–52.0)
Hemoglobin: 10.5 g/dL — ABNORMAL LOW (ref 13.0–17.0)
MCH: 23 pg — ABNORMAL LOW (ref 26.0–34.0)
MCHC: 34.7 g/dL (ref 30.0–36.0)
MCV: 66.3 fL — AB (ref 78.0–100.0)
Platelets: 159 10*3/uL (ref 150–400)
RBC: 4.57 MIL/uL (ref 4.22–5.81)
RDW: 16.2 % — AB (ref 11.5–15.5)
WBC: 18.9 10*3/uL — ABNORMAL HIGH (ref 4.0–10.5)

## 2016-09-18 LAB — PROTEIN / CREATININE RATIO, URINE
Creatinine, Urine: 145.2 mg/dL
Protein Creatinine Ratio: 0.64 mg/mg{Cre} — ABNORMAL HIGH (ref 0.00–0.15)
Total Protein, Urine: 93 mg/dL

## 2016-09-18 LAB — CK: CK TOTAL: 712 U/L — AB (ref 49–397)

## 2016-09-18 LAB — BILIRUBIN, FRACTIONATED(TOT/DIR/INDIR)
BILIRUBIN DIRECT: 0.4 mg/dL (ref 0.1–0.5)
BILIRUBIN INDIRECT: 0.7 mg/dL (ref 0.3–0.9)
Total Bilirubin: 1.1 mg/dL (ref 0.3–1.2)

## 2016-09-18 LAB — IRON AND TIBC
Iron: 29 ug/dL — ABNORMAL LOW (ref 45–182)
Saturation Ratios: 17 % — ABNORMAL LOW (ref 17.9–39.5)
TIBC: 169 ug/dL — AB (ref 250–450)
UIBC: 140 ug/dL

## 2016-09-18 LAB — SODIUM, URINE, RANDOM: Sodium, Ur: 42 mmol/L

## 2016-09-18 LAB — FERRITIN: Ferritin: 1011 ng/mL — ABNORMAL HIGH (ref 24–336)

## 2016-09-18 LAB — CREATININE, URINE, RANDOM: CREATININE, URINE: 146.26 mg/dL

## 2016-09-18 MED ORDER — SODIUM CHLORIDE 0.9 % IV SOLN
INTRAVENOUS | Status: DC
  Administered 2016-09-18 – 2016-09-19 (×5): via INTRAVENOUS

## 2016-09-18 MED ORDER — ACETAMINOPHEN 325 MG PO TABS: 650.0000 mg | ORAL_TABLET | Freq: Four times a day (QID) | ORAL | Status: DC | PRN

## 2016-09-18 MED ORDER — HEPARIN SODIUM (PORCINE) 5000 UNIT/ML IJ SOLN
5000.0000 [IU] | Freq: Three times a day (TID) | INTRAMUSCULAR | Status: DC
  Administered 2016-09-18 – 2016-09-21 (×11): 5000 [IU] via SUBCUTANEOUS
  Filled 2016-09-18 (×11): qty 1

## 2016-09-18 MED ORDER — ONDANSETRON HCL 4 MG PO TABS: 4.0000 mg | ORAL_TABLET | Freq: Four times a day (QID) | ORAL | Status: DC | PRN

## 2016-09-18 MED ORDER — DAKINS (1/4 STRENGTH) 0.125 % EX SOLN
Freq: Two times a day (BID) | CUTANEOUS | Status: AC
  Administered 2016-09-18 – 2016-09-20 (×6)
  Filled 2016-09-18: qty 473

## 2016-09-18 MED ORDER — HYDROMORPHONE HCL 1 MG/ML IJ SOLN
0.4000 mg | INTRAMUSCULAR | Status: DC | PRN
  Administered 2016-09-18 (×3): 0.4 mg via INTRAVENOUS
  Administered 2016-09-19: 4 mg via INTRAVENOUS
  Administered 2016-09-19 – 2016-09-20 (×4): 0.4 mg via INTRAVENOUS
  Filled 2016-09-18 (×8): qty 1

## 2016-09-18 MED ORDER — INSULIN ASPART 100 UNIT/ML ~~LOC~~ SOLN
0.0000 [IU] | Freq: Three times a day (TID) | SUBCUTANEOUS | Status: DC | PRN
  Administered 2016-09-18: 1 [IU] via SUBCUTANEOUS
  Filled 2016-09-18: qty 0.09

## 2016-09-18 MED ORDER — ACETAMINOPHEN 650 MG RE SUPP
650.0000 mg | Freq: Four times a day (QID) | RECTAL | Status: DC | PRN
  Administered 2016-09-21: 650 mg via RECTAL
  Filled 2016-09-18: qty 1

## 2016-09-18 MED ORDER — ONDANSETRON HCL 4 MG/2ML IJ SOLN
4.0000 mg | Freq: Four times a day (QID) | INTRAMUSCULAR | Status: DC | PRN
Start: 2016-09-18 — End: 2016-09-22

## 2016-09-18 MED ORDER — THIAMINE HCL 100 MG/ML IJ SOLN
100.0000 mg | Freq: Every day | INTRAMUSCULAR | Status: DC
  Administered 2016-09-18 – 2016-09-21 (×4): 100 mg via INTRAVENOUS
  Filled 2016-09-18 (×4): qty 2

## 2016-09-18 NOTE — Consult Note (Signed)
Referring Provider: No ref. provider found Primary Care Physician:  Patient, No Pcp Per Primary Nephrologist:  Dr.   Jaquita Rector for Consultation:  Acute oliguric renal failure   HPI:  Shaun Pena is a 83 y.o. male with a past medical history significant for NIDDM no longer on meds, alcoholism, dementia and recent humerus fracture who presents with sacral ulcer and found to have renal failure.  Caveat that the patient is unable to provide any history due to altered mentation, all history collected from daughter at the bedside.  The patient was in his usual state of health (ambulates with cane, mild dementia, lives with a brother in law and stepson, but independent with all ADLs) until about 3 weeks ago when he was drunk, fell and broke his arm. He was seen in the emergency room here, placed in a sling and discharged home with acetaminophen and ibuprofen for pain control. He returned the emergency room about one week ago with failure to thrive at home, daughter seeking placement. Social work was consulted in the emergency room, and the patient was provided with "additional outpatient resources". He had hypokalemia at that time, supplemented orally, and renal function was normal.  Patient does not have much in the way of urine output and has a condom catheter   There may have been some use of NSAIDS as ibuprofen on list   No ACE or ARB use. There does not appear to be any hypotension or shock      Past Medical History:  Diagnosis Date  . Anemia    microcytic with MCV = 72 and Hg/Hct WNL  . Diabetes mellitus   . Hyperlipidemia     Past Surgical History:  Procedure Laterality Date  . NO PAST SURGERIES      Prior to Admission medications   Medication Sig Start Date End Date Taking? Authorizing Provider  acetaminophen (TYLENOL) 650 MG CR tablet Take 1,950 mg by mouth daily as needed for pain.    Yes [provider]  ibuprofen (ADVIL,MOTRIN) 200 MG tablet Take 400 mg by mouth  every 6 (six) hours as needed for mild pain or moderate pain.   Yes [provider]  folic acid (FOLVITE) 1 MG tablet Take 1 tablet (1 mg total) by mouth daily. Patient not taking: Reported on 08/25/2016 03/24/13   Clydia Llano, MD  HYDROcodone-acetaminophen (NORCO) 5-325 MG per tablet Take 1 tablet by mouth every 6 (six) hours as needed for moderate pain. Patient not taking: Reported on 08/25/2016 03/24/13   Clydia Llano, MD  Multiple Vitamin (MULTIVITAMIN WITH MINERALS) TABS tablet Take 1 tablet by mouth daily. Patient not taking: Reported on 08/25/2016 03/24/13   Clydia Llano, MD  potassium chloride SA (K-DUR,KLOR-CON) 20 MEQ tablet Take 1 tablet (20 mEq total) by mouth daily. Patient not taking: Reported on 09/17/2016 09/07/16   Fayrene Helper, PA-C  traMADol (ULTRAM) 50 MG tablet Take 1 tablet (50 mg total) by mouth every 6 (six) hours as needed for moderate pain or severe pain. Patient not taking: Reported on 09/17/2016 09/07/16   Fayrene Helper, PA-C    Current Facility-Administered Medications  Medication Dose Route Frequency Provider Last Rate Last Dose  . 0.9 %  sodium chloride infusion   Intravenous Continuous Alberteen Sam, MD 125 mL/hr at 09/18/16 0844    . acetaminophen (TYLENOL) tablet 650 mg  650 mg Oral Q6H PRN Danford, Earl Lites, MD       Or  . acetaminophen (TYLENOL) suppository 650 mg  650 mg Rectal Q6H PRN Danford, Earl Liteshristopher P, MD      . heparin injection 5,000 Units  5,000 Units Subcutaneous Q8H Danford, Earl Liteshristopher P, MD   5,000 Units at 09/18/16 1421  . HYDROmorphone (DILAUDID) injection 0.4 mg  0.4 mg Intravenous Q4H PRN Alberteen Samanford, Christopher P, MD   0.4 mg at 09/18/16 1420  . insulin aspart (novoLOG) injection 0-9 Units  0-9 Units Subcutaneous Q8H PRN Alberteen Samanford, Christopher P, MD   1 Units at 09/18/16 0954  . ondansetron (ZOFRAN) tablet 4 mg  4 mg Oral Q6H PRN Danford, Earl Liteshristopher P, MD       Or  . ondansetron (ZOFRAN) injection 4 mg  4 mg Intravenous Q6H  PRN Danford, Earl Liteshristopher P, MD      . sodium hypochlorite (DAKIN'S 1/4 STRENGTH) topical solution   Irrigation BID Emokpae, Courage, MD      . thiamine (B-1) injection 100 mg  100 mg Intravenous Daily Alberteen Samanford, Christopher P, MD   100 mg at 09/18/16 0949    Allergies as of 09/17/2016  . (No Known Allergies)    Family History  Problem Relation Age of Onset  . Diabetes Mellitus II Other   . CAD Other   . Stroke Neg Hx   . Cancer Neg Hx     Social History   Social History  . Marital status: Married    Spouse name: N/A  . Number of children: N/A  . Years of education: N/A   Occupational History  . Not on file.   Social History Main Topics  . Smoking status: Current Every Day Smoker    Packs/day: 1.00  . Smokeless tobacco: Never Used  . Alcohol use No  . Drug use: No  . Sexual activity: Not on file   Other Topics Concern  . Not on file   Social History Narrative  . No narrative on file    Review of Systems: Unobtainable  Physical Exam: Vital signs in last 24 hours: Temp:  [97.4 F (36.3 C)-98.9 F (37.2 C)] 97.8 F (36.6 C) (06/20 0431) Pulse Rate:  [76-101] 76 (06/20 0431) Resp:  [14-18] 18 (06/20 0431) BP: (128-136)/(75-83) 128/76 (06/20 0431) SpO2:  [96 %-100 %] 98 % (06/20 0431) Weight:  [145 lb (65.8 kg)] 145 lb (65.8 kg) (06/20 0100) Last BM Date: 09/17/16 General:   Disheveled man with contracted extremities  Head:  Normocephalic and atraumatic. Eyes:  Sclera clear, no icterus.   Conjunctiva pink. Ears:  Normal auditory acuity. Nose:  No deformity, discharge,  or lesions. Mouth:  Dry mucus membrane Neck:  Supple; no masses or thyromegaly. JVP not elevated Lungs:  Clear throughout to auscultation.   No wheezes, crackles, or rhonchi. No acute distress. Heart:  Regular rate and rhythm; no murmurs, clicks, rubs,  or gallops. Abdomen:  Tender suprapubic area to palpation Msk:  Symmetrical without gross deformities. Normal posture. Pulses:  No carotid,  renal, femoral bruits. DP and PT symmetrical and equal Extremities:  Without clubbing or edema.  Right trochanter 7cm x 9cm x0cm unstageable black eschar, leaking from under eschar, malodorous Sacrum ulcer 5cm x 6cm unstageable with a stage III in center of gluteal cleft 2.5cm x 1.5cm x 0,5cm malodorous Right wrist has a mixture wound of unstageable with DTI surrounding 2.5cm x 2 x 0.1cm no drainage or odor Neurologic:  Alert and  oriented x4;  grossly normal neurologically.   Intake/Output from previous day: 06/19 0701 - 06/20 0700 In: 325 [I.V.:325] Out: -  Intake/Output this  shift: Total I/O In: 562.5 [I.V.:562.5] Out: 10 [Urine:10]  Lab Results:  Recent Labs  09/17/16 2231 09/18/16 0443  WBC 18.8* 18.9*  HGB 11.4* 10.5*  HCT 32.8* 30.3*  PLT 167 159   BMET  Recent Labs  09/17/16 2231 09/18/16 0443  NA 139 141  K 4.6 4.6  CL 93* 96*  CO2 19* 21*  GLUCOSE 193* 147*  BUN 162* 168*  CREATININE 14.73* 14.62*  CALCIUM 9.1 8.9   LFT  Recent Labs  09/17/16 2231 09/18/16 0443  PROT 7.2 6.4*  ALBUMIN 3.1* 2.8*  AST 29 24  ALT 18 15*  ALKPHOS 51 45  BILITOT 1.1  1.4* 1.1  BILIDIR 0.4  --   IBILI 0.7  --    PT/INR No results for input(s): LABPROT, INR in the last 72 hours. Hepatitis Panel No results for input(s): HEPBSAG, HCVAB, HEPAIGM, HEPBIGM in the last 72 hours.  Studies/Results: Dg Shoulder Right  Result Date: 09/17/2016 CLINICAL DATA:  83 y/o  M; right shoulder pain. EXAM: RIGHT SHOULDER - 2+ VIEW COMPARISON:  09/07/2016 right shoulder radiographs. FINDINGS: Mildly comminuted humeral neck fracture with approximately 1/2 shaft's width medial displacement of the shaft relative to the head unchanged from prior radiographs. No shoulder joint dislocation. IMPRESSION: Mildly displaced and comminuted right humeral neck fracture grossly unchanged from prior radiographs. No joint dislocation. Electronically Signed   By: Mitzi Hansen M.D.   On:  09/17/2016 22:34   Ct Head Wo Contrast  Result Date: 09/17/2016 CLINICAL DATA:  Ataxia. EXAM: CT HEAD WITHOUT CONTRAST TECHNIQUE: Contiguous axial images were obtained from the base of the skull through the vertex without intravenous contrast. COMPARISON:  Head CT 03/09/2013 FINDINGS: Brain: No evidence of acute infarction, hemorrhage, hydrocephalus, extra-axial collection or mass lesion/mass effect. Stable generalized cerebral and cerebellar atrophy. Stable chronic small vessel ischemia. Vascular: Atherosclerosis of skullbase vasculature without hyperdense vessel or abnormal calcification. Skull: No skull fracture or focal lesion. Sinuses/Orbits: Paranasal sinuses and mastoid air cells are clear. The visualized orbits are unremarkable. Other: None. IMPRESSION: No acute intracranial abnormality. Stable atrophy and chronic small vessel ischemia. Electronically Signed   By: Rubye Oaks M.D.   On: 09/17/2016 22:46    Assessment/Plan:  Acute oliguric renal injury with suprapubic tenderness  I suspect obstruction although certainly could be ATN although I see no shock or hypotension and apart from NSAIDS there is no use of nephrotoxic agents  HTN controlled  No edema  Bones will follow calcium and phosphorus  Anemia will evaluate for ESA    LOS: 0 Ryley Bachtel W @TODAY @2 :28 PM
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
## 2016-09-18 NOTE — Progress Notes (Signed)
Patient seen and evaluated, chart reviewed, please see EMR for updated orders. Please see full H&P dictated by admitting physician for same date of service.   Spoke with pt's oldest Biological daughter (Janice)at bedside initially,  Follow up discussion with Ms [name] and  Lindie SpruceMeghan (SW) and Pastoral services representative also present.  Ms [name] has POA, but NOT HCPOA....  Patient apparently has 4 living biological kids and 2 step kids. One of the biological kids is apparently incarcerated.  Ms [name] will attempt to reach out to her siblings and see if they can come to the hospital on 09/19/2016 to try to help make some decisions and to a point his surrogate decision maker.  Patient remains full code at this time   Patient seen and evaluated, chart reviewed, please see EMR for updated orders. Please see full H&P dictated by admitting physician for same date of service.

## 2016-09-18 NOTE — Progress Notes (Signed)
PT Cancellation Note  Patient Details Name: Shaun Pena MRN: [name] DOB: 1933/07/17   Cancelled Treatment:    Reason Eval/Treat Not Completed: Patient at procedure or test/unavailable. Nursing attending to pt on several attempts.  Pt switching to air mattress, getting cleaned, getting meds.  Will check back as schedule permits.   Enzo MontgomeryKaren L Honest Vanleer 09/18/2016, 2:58 PM
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
## 2016-09-18 NOTE — Consult Note (Signed)
WOC Nurse wound consult note Reason for Consult: Sacral and R trochanter DTI, unstageable Wound type: pressure Pressure Injury POA: Yes Measurement:Right trochanter 7cm x 9cm x0cm unstageable black eschar, leaking from under eschar, malodorous Sacrum ulcer 5cm x 6cm unstageable with a stage III in center of gluteal cleft 2.5cm x 1.5cm x 0,5cm malodorous Right wrist has a mixture wound of unstageable with DTI surrounding 2.5cm x 2 x 0.1cm no drainage or odor Wound bed:see above Drainage (amount, consistency, odor) see above Periwound:intact Dressing procedure/placement/frequency:Patient is scheduled to have Palliative consult with family to decide direction of care. I will keep to a conservative treatment plan using Dakins for three days and changing over to NS moist to dry dressing changes. I have ordered Prevalon boots as well as a low air loss mattress. We will not follow, but will remain available to this patient, to nursing, and the medical and/or surgical teams.  Please re-consult if we need to assist further.    Barnett HatterMelinda Braidan Ricciardi, RN-C, WTA-C Wound Treatment Associate

## 2016-09-19 LAB — BASIC METABOLIC PANEL
Anion gap: 14 (ref 5–15)
BUN: 90 mg/dL — AB (ref 6–20)
CHLORIDE: 122 mmol/L — AB (ref 101–111)
CO2: 25 mmol/L (ref 22–32)
CREATININE: 2.33 mg/dL — AB (ref 0.61–1.24)
Calcium: 9.1 mg/dL (ref 8.9–10.3)
GFR calc Af Amer: 29 mL/min — ABNORMAL LOW (ref >60)
GFR calc non Af Amer: 25 mL/min — ABNORMAL LOW (ref >60)
GLUCOSE: 117 mg/dL — AB (ref 65–99)
Potassium: 3.5 mmol/L (ref 3.5–5.1)
SODIUM: 161 mmol/L — AB (ref 135–145)

## 2016-09-19 LAB — GLUCOSE, CAPILLARY
GLUCOSE-CAPILLARY: 116 mg/dL — AB (ref 65–99)
GLUCOSE-CAPILLARY: 176 mg/dL — AB (ref 65–99)
GLUCOSE-CAPILLARY: 158 mg/dL — AB (ref 65–99)
Glucose-Capillary: 104 mg/dL — ABNORMAL HIGH (ref 65–99)
Glucose-Capillary: 112 mg/dL — ABNORMAL HIGH (ref 65–99)

## 2016-09-19 LAB — HEMOGLOBIN A1C
Hgb A1c MFr Bld: 5.9 % — ABNORMAL HIGH (ref 4.8–5.6)
Mean Plasma Glucose: 123 mg/dL

## 2016-09-19 MED ORDER — INSULIN ASPART 100 UNIT/ML ~~LOC~~ SOLN
0.0000 [IU] | SUBCUTANEOUS | Status: DC
  Administered 2016-09-19 – 2016-09-20 (×2): 2 [IU] via SUBCUTANEOUS
  Administered 2016-09-20: 1 [IU] via SUBCUTANEOUS
  Administered 2016-09-20 (×2): 3 [IU] via SUBCUTANEOUS
  Administered 2016-09-21: 1 [IU] via SUBCUTANEOUS
  Administered 2016-09-21: 2 [IU] via SUBCUTANEOUS
  Administered 2016-09-21: 1 [IU] via SUBCUTANEOUS

## 2016-09-19 MED ORDER — INSULIN ASPART 100 UNIT/ML ~~LOC~~ SOLN
0.0000 [IU] | Freq: Three times a day (TID) | SUBCUTANEOUS | Status: DC
  Administered 2016-09-19: 2 [IU] via SUBCUTANEOUS

## 2016-09-19 MED ORDER — DEXTROSE 5 % IV SOLN
INTRAVENOUS | Status: DC
Start: 2016-09-19 — End: 2016-09-20
  Administered 2016-09-19: 1000 mL via INTRAVENOUS
  Administered 2016-09-19: 13:00:00 via INTRAVENOUS
  Administered 2016-09-20: 1000 mL via INTRAVENOUS
  Administered 2016-09-20: 14:00:00 via INTRAVENOUS

## 2016-09-19 NOTE — Progress Notes (Signed)
CRITICAL VALUE ALERT  Critical Value: NA 161  Date & Time Notied:  1245  Provider Notified: 1246 DR .Bernie Fobes  Orders Received/Actions taken:  D5 W at 125 ml/hr, Repeat BMP in am

## 2016-09-19 NOTE — Progress Notes (Signed)
For the shift, pt has had about 1500cc of IVF as intake (pt is NPO), and has had 3500cc of urine output via foley. Urine is dark, tea colored to reddish brown in color. Paged on call NP to make aware of extreme I/O imbalance, awaiting response. Continue to monitor. Mick SellShannon Mayvis Agudelo RN

## 2016-09-19 NOTE — Progress Notes (Signed)
[facility]. Awaiting PT eval for recommendations. Sandford Crazeora Yates Weisgerber RN,BSN,NCM [phone]

## 2016-09-19 NOTE — Progress Notes (Signed)
Spoke with pt's daughter- wants to plan for SNF at [place] at this point, understanding further medical workup still pending.  [facility] CSW completed FL2 and submitted referrals to preferred and other area facilities.   Will follow for disposition plans.   Ilean SkillMeghan Elex Mainwaring, MSW, LCSW Clinical Social Work 09/19/2016 [phone]

## 2016-09-19 NOTE — Evaluation (Signed)
Physical Therapy Evaluation Patient Details Name: Shaun Pena MRN: [id number] DOB: 29-Aug-1933 Today's Date: 09/19/2016   History of Present Illness  Pt was admitted with ulcers and found to have renal failure.  PMH:  NIDDM, ETOH, mild dementia and mild displaced comminuted humeral neck fx (R)  Clinical Impression  Pt admitted with above diagnosis. Pt currently with functional limitations due to the deficits listed below (see PT Problem List).  Pt will benefit from skilled PT to increase their independence and safety with mobility to allow discharge to the venue listed below.  Pt requires TOT A of 2 for bed mobility and tolerated 90 seconds of sitting EOB.  He was able to follow simple commands.  Recommend SNF.     Follow Up Recommendations SNF;Supervision/Assistance - 24 hour    Equipment Recommendations  None recommended by PT    Recommendations for Other Services       Precautions / Restrictions Precautions Precautions: Shoulder;Fall Precaution Comments: requested sling.  Per chart, pt has not been tolerating.  Used when moving pt Restrictions Weight Bearing Restrictions: Yes Other Position/Activity Restrictions: NWB      Mobility  Bed Mobility Overal bed mobility: Needs Assistance Bed Mobility: Rolling;Supine to Sit;Sit to Supine Rolling: Total assist;+2 for physical assistance   Supine to sit: Total assist;+2 for physical assistance Sit to supine: Total assist;+2 for physical assistance   General bed mobility comments: pt initiated moving legs to EOB and trunk down to bed, but performs less than 25% of activity.  Used pad to assist with bed mobility and requested and used sling to support RUE.  Left sling strap off secondary to notes that he wasn't tolerating this  Transfers                 General transfer comment: unable at this time  Ambulation/Gait                Stairs            Wheelchair Mobility    Modified Rankin (Stroke Patients  Only)       Balance Overall balance assessment: Needs assistance;History of Falls   Sitting balance-Leahy Scale: Poor Sitting balance - Comments: Tolerated 90 seconds of sitting EOB                                     Pertinent Vitals/Pain Pain Assessment: Faces Faces Pain Scale: Hurts even more Pain Location: all over Pain Intervention(s): Limited activity within patient's tolerance;Monitored during session;Repositioned;Patient requesting pain meds-RN notified    Home Living Family/patient expects to be discharged to:: Skilled nursing facility                 Additional Comments: was living with family    Prior Function Level of Independence: Needs assistance         Comments: recent decline in status. Per chart, ambulated with cane     Hand Dominance        Extremity/Trunk Assessment   Upper Extremity Assessment Upper Extremity Assessment: Defer to OT evaluation RUE Deficits / Details: humerus fx  able to move wrist and fingers LUE Deficits / Details: generalized weakness. Able to lift LUE to 80 FF    Lower Extremity Assessment Lower Extremity Assessment: Generalized weakness;RLE deficits/detail RLE Deficits / Details: large bony prominence distal to knee. 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       General Comments General comments (skin integrity, edema, etc.): Pt on air mattress due to sacral wound    Exercises     Assessment/Plan    PT Assessment Patient needs continued PT services  PT Problem List Decreased strength;Decreased range of  motion;Decreased activity tolerance;Decreased balance;Decreased mobility       PT Treatment Interventions DME instruction;Functional mobility training;Therapeutic exercise;Therapeutic activities;Balance training    PT Goals (Current goals can be found in the Care Plan section)  Acute Rehab PT Goals Patient Stated Goal: none stated PT Goal Formulation: Patient unable to participate in goal setting Time For Goal Achievement: 10/03/16 Potential to Achieve Goals: Fair    Frequency Min 2X/week   Barriers to discharge Decreased caregiver support      Co-evaluation   Reason for Co-Treatment: For patient/therapist safety;Complexity of the patient's impairments (multi-system involvement) PT goals addressed during session: Mobility/safety with mobility OT goals addressed during session: ADL's and self-care       AM-PAC PT "6 Clicks" Daily Activity  Outcome Measure Difficulty turning over in bed (including adjusting bedclothes, sheets and blankets)?: Total Difficulty moving from lying on back to sitting on the side of the bed? : Total Difficulty sitting down on and standing up from a chair with arms (e.g., wheelchair, bedside commode, etc,.)?: Total Help needed moving to and from a bed to chair (including a wheelchair)?: Total Help needed walking in hospital room?: Total Help needed climbing 3-5 steps with a railing? : Total 6 Click Score: 6    End of Session Equipment Utilized During Treatment: Other (comment) (sling) Activity Tolerance: Patient limited by fatigue Patient left: in bed;with call bell/phone within reach;with bed alarm set;with nursing/sitter in room Nurse Communication: Mobility status PT Visit Diagnosis: Other abnormalities of gait and mobility (R26.89);Adult, failure to thrive (R62.7)    Time: 6045-4098 PT Time Calculation (min) (ACUTE ONLY): 34 min   Charges:   PT Evaluation $PT Eval Low Complexity: 1 Procedure     PT G Codes:        Naeemah Jasmer L. Katrinka Blazing,  Benson Pager [phone] 09/19/2016   [name] 09/19/2016, 12:54 PM

## 2016-09-19 NOTE — Evaluation (Signed)
Clinical/Bedside Swallow Evaluation Patient Details  Name: Shaun Pena MRN: [id number] Date of Birth: October 28, 1933  Today's Date: 09/19/2016 Time: SLP Start Time (ACUTE ONLY): 0935 SLP Stop Time (ACUTE ONLY): 0958 SLP Time Calculation (min) (ACUTE ONLY): 23 min  Past Medical History:  Past Medical History:  Diagnosis Date  . Anemia    microcytic with MCV = 72 and Hg/Hct WNL  . Diabetes mellitus   . Hyperlipidemia    Past Surgical History:  Past Surgical History:  Procedure Laterality Date  . NO PAST SURGERIES     HPI:  83 y.o. male admitted on 09/17/17 with a past medical history significant for NIDDM no longer on meds, alcoholism, dementia and recent humerus fracture who presents with sacral ulcer and found to have renal failure; MD note indicated pt having difficulty managing secretions on 09/18/16; pt's orientation improved from 09/18/16 as he is currently oriented to self/place, but not date and/or situation.  Assessment / Plan / Recommendation Clinical Impression   Pt with oropharyngeal dysphagia characterized by overt s/s of aspiration during BSE with ice chips/thin liquids including watery eyes, delayed throat clear and cough, weak cough, multiple swallows and suspected delay in the initiation of the swallow.  Recommend continuing NPO until objective assessment is completed to determine safest diet consistency; MD requested 09/20/16 for MBS as pt with continued improvement of orientation/LOA since 09/18/16, but wants him to remain NPO until 09/20/16 to improve chance for po intake; ST will f/u while in house for safest diet determination and advancement pending objective assessment. SLP Visit Diagnosis: Dysphagia, oropharyngeal phase (R13.12)    Aspiration Risk  Severe aspiration risk    Diet Recommendation   NPO  Medication Administration: Via alternative means    Other  Recommendations Oral Care Recommendations: Oral care QID   Follow up Recommendations Other (comment) (TBD)       Frequency and Duration min 2x/week  1 week       Prognosis Prognosis for Safe Diet Advancement: Good Barriers to Reach Goals: Cognitive deficits      Swallow Study   General Date of Onset: 09/17/16 HPI: 83 y.o. male with a past medical history significant for NIDDM no longer on meds, alcoholism, dementia and recent humerus fracture who presents with sacral ulcer and found to have renal failure. Type of Study: Bedside Swallow Evaluation Diet Prior to this Study: NPO Temperature Spikes Noted: No Respiratory Status: Room air History of Recent Intubation: No Behavior/Cognition: Alert;Cooperative;Confused Oral Cavity Assessment: Dry Oral Care Completed by SLP: Yes Oral Cavity - Dentition: Poor condition;Missing dentition Vision: Functional for self-feeding Self-Feeding Abilities: Able to feed self;Needs assist Patient Positioning: Upright in bed Baseline Vocal Quality: Hoarse;Low vocal intensity Volitional Cough: Weak Volitional Swallow: Able to elicit    Oral/Motor/Sensory Function Overall Oral Motor/Sensory Function: Generalized oral weakness Facial ROM: Within Functional Limits Facial Symmetry: Within Functional Limits Lingual Symmetry: Within Functional Limits Lingual Strength: Reduced   Ice Chips Ice chips: Impaired Presentation: Spoon Pharyngeal Phase Impairments: Suspected delayed Swallow;Multiple swallows;Cough - Delayed   Thin Liquid Thin Liquid: Impaired Presentation: Spoon Oral Phase Impairments: Reduced lingual movement/coordination Oral Phase Functional Implications: Prolonged oral transit;Oral holding Pharyngeal  Phase Impairments: Suspected delayed Swallow;Multiple swallows;Throat Clearing - Delayed    Nectar Thick Nectar Thick Liquid: Not tested   Honey Thick Honey Thick Liquid: Not tested   Puree Puree: Not tested   Solid      Solid: Not tested    Functional Assessment Tool Used: NOMS; clinical judgment Functional Limitations:  Swallowing Swallow Current Status [phone](G8996): At least 60 percent but less than 80 percent impaired, limited or restricted Swallow Goal Status [phone](G8997): At least 40 percent but less than 60 percent impaired, limited or restricted   Tressie StalkerPat Rainer Mounce, M.S., CCC-SLP 09/19/2016,10:35 AM

## 2016-09-19 NOTE — NC FL2 (Signed)
[place] MEDICAID FL2 LEVEL OF CARE SCREENING TOOL     IDENTIFICATION  Patient Name: Shaun Pena Birthdate: May 08, 1933 Sex: male Admission Date (Current Location): 09/17/2016  [facility] and [place] Number:  Producer, television/film/video and Address:  [facility],  [address] [place] [address]      Provider Number: [id number]  Attending Physician Name and Address:  Shon Hale, MD  Relative Name and Phone Number:       Current Level of Care: Hospital Recommended Level of Care: Skilled Nursing Facility Prior Approval Number:    Date Approved/Denied:   PASRR Number: [id number]  Discharge Plan: SNF    Current Diagnoses: Patient Active Problem List   Diagnosis Date Noted  . Acute renal failure (ARF) (HCC) 09/18/2016  . Type 2 diabetes mellitus without complication, without long-term current use of insulin (HCC) 09/18/2016  . Pressure injury of skin 09/18/2016  . Proximal humerus fracture 03/24/2013  . Protein-calorie malnutrition, severe (HCC) 03/23/2013  . Weakness 03/22/2013  . Alcoholism (HCC) 03/22/2013  . Microcytic anemia 05/05/2006    Orientation RESPIRATION BLADDER Height & Weight     Self  Normal Indwelling catheter Weight: 145 lb (65.8 kg) (as of 6/9--will have to have current weight) Height:  6' 3.5" (191.8 cm)  BEHAVIORAL SYMPTOMS/MOOD NEUROLOGICAL BOWEL NUTRITION STATUS      Continent Diet (NPO)  AMBULATORY STATUS COMMUNICATION OF NEEDS Skin   Extensive Assist Verbally PU Stage and Appropriate Care     Sacral and R trochanter DTI, unstageable Wound type: pressure Pressure Injury POA: Yes Measurement:Right trochanter 7cm x 9cm x0cm unstageable black eschar, leaking from under eschar, malodorous Sacrum ulcer 5cm x 6cm unstageable with a stage III in center of gluteal cleft 2.5cm x 1.5cm x 0,5cm malodorous Right wrist has a mixture wound of unstageable with DTI surrounding 2.5cm x 2 x 0.1cm no drainage or odor Wound bed:see  above Drainage (amount, consistency, odor) see above Periwound:intact Dressing procedure/placement/frequency:conservative treatment plan using Dakins for three days and changing over to NS moist to dry dressing changes                   Personal Care Assistance Level of Assistance  Bathing, Feeding, Dressing Bathing Assistance: Maximum assistance Feeding assistance: Maximum assistance Dressing Assistance: Maximum assistance     Functional Limitations Info  Sight, Hearing, Speech Sight Info: Adequate Hearing Info: Adequate Speech Info: Adequate    SPECIAL CARE FACTORS FREQUENCY  PT (By licensed PT), OT (By licensed OT), Speech therapy     PT Frequency: 5x OT Frequency: 5x     Speech Therapy Frequency: 3x      Contractures Contractures Info: Not present    Additional Factors Info  Code Status, Allergies Code Status Info: full Allergies Info: nka           Current Medications (09/19/2016):  This is the current hospital active medication list Current Facility-Administered Medications  Medication Dose Route Frequency Provider Last Rate Last Dose  . acetaminophen (TYLENOL) tablet 650 mg  650 mg Oral Q6H PRN Danford, Earl Lites, MD       Or  . acetaminophen (TYLENOL) suppository 650 mg  650 mg Rectal Q6H PRN Danford, Earl Lites, MD      . dextrose 5 % solution   Intravenous Continuous Emokpae, Courage, MD 125 mL/hr at 09/19/16 1326    . heparin injection 5,000 Units  5,000 Units Subcutaneous Q8H Danford, Earl Lites, MD   5,000 Units at 09/19/16 1325  .  HYDROmorphone (DILAUDID) injection 0.4 mg  0.4 mg Intravenous Q4H PRN Alberteen Samanford, Christopher P, MD   4 mg at 09/19/16 1117  . insulin aspart (novoLOG) injection 0-9 Units  0-9 Units Subcutaneous Q8H PRN Alberteen Samanford, Christopher P, MD   1 Units at 09/18/16 0954  . insulin aspart (novoLOG) injection 0-9 Units  0-9 Units Subcutaneous TID WC Emokpae, Courage, MD      . ondansetron (ZOFRAN) tablet 4 mg  4 mg Oral Q6H PRN  Danford, Earl Liteshristopher P, MD       Or  . ondansetron (ZOFRAN) injection 4 mg  4 mg Intravenous Q6H PRN Danford, Earl Liteshristopher P, MD      . sodium hypochlorite (DAKIN'S 1/4 STRENGTH) topical solution   Irrigation BID Emokpae, Courage, MD      . thiamine (B-1) injection 100 mg  100 mg Intravenous Daily Alberteen Samanford, Christopher P, MD   100 mg at 09/19/16 1109     Discharge Medications: Please see discharge summary for a list of discharge medications.  Relevant Imaging Results:  Relevant Lab Results:   Additional Information SS# [id number]  Nelwyn SalisburyMeghan R Christhoper Busbee, LCSW

## 2016-09-19 NOTE — Progress Notes (Signed)
[facility] ROUNDING NOTE   Subjective:   Interval History: Shaun NievesJames Pena Kelleyis a 83 y.o.malewith a past medical history significant for NIDDM no longer on meds, alcoholism, dementia and recent humerus fracturewho presents with sacral ulcer and found to have renal failure  Great output with the  placement of foley catheter  > 4 L output     Labs pending this am   Objective:  Vital signs in last 24 hours:  Temp:  [97.6 F (36.4 C)-97.8 F (36.6 C)] 97.8 F (36.6 C) (06/21 0508) Pulse Rate:  [80-96] 96 (06/21 0508) Resp:  [16-18] 18 (06/21 0508) BP: (119-136)/(56-69) 119/56 (06/21 0508) SpO2:  [98 %-100 %] 100 % (06/21 0508)  Weight change:  Filed Weights   09/18/16 0100  Weight: 145 lb (65.8 kg)    Intake/Output: I/O last 3 completed shifts: In: 3439.6 [I.V.:3439.6] Out: 4360 [Urine:4360]   Intake/Output this shift:  No intake/output data recorded. Debilitated  CVS- RRR RS- CTA ABD- BS present soft non-distended EXT- no edema   Basic Metabolic Panel:  Recent Labs Lab 09/17/16 2231 09/18/16 0443  NA 139 141  K 4.6 4.6  CL 93* 96*  CO2 19* 21*  GLUCOSE 193* 147*  BUN 162* 168*  CREATININE 14.73* 14.62*  CALCIUM 9.1 8.9    Liver Function Tests:  Recent Labs Lab 09/17/16 2231 09/18/16 0443  AST 29 24  ALT 18 15*  ALKPHOS 51 45  BILITOT 1.1  1.4* 1.1  PROT 7.2 6.4*  ALBUMIN 3.1* 2.8*   No results for input(s): LIPASE, AMYLASE in the last 168 hours. No results for input(s): AMMONIA in the last 168 hours.  CBC:  Recent Labs Lab 09/17/16 2231 09/18/16 0443  WBC 18.8* 18.9*  NEUTROABS 16.2*  --   HGB 11.4* 10.5*  HCT 32.8* 30.3*  MCV 65.6* 66.3*  PLT 167 159    Cardiac Enzymes:  Recent Labs Lab 09/17/16 2231 09/18/16 0443  CKTOTAL 822* 712*    BNP: Invalid input(s): POCBNP  CBG:  Recent Labs Lab 09/18/16 0903 09/18/16 1645 09/19/16 0006 09/19/16 0757  GLUCAP 150* 112* 112* 104*    Microbiology: No  results found for this or any previous visit.  Coagulation Studies: No results for input(s): LABPROT, INR in the last 72 hours.  Urinalysis:  Recent Labs  09/18/16 0845  COLORURINE AMBER*  LABSPEC 1.014  PHURINE 5.0  GLUCOSEU NEGATIVE  HGBUR LARGE*  BILIRUBINUR NEGATIVE  KETONESUR NEGATIVE  PROTEINUR 100*  NITRITE NEGATIVE  LEUKOCYTESUR MODERATE*      Imaging: Dg Shoulder Right  Result Date: 09/17/2016 CLINICAL DATA:  83 y/o  M; right shoulder pain. EXAM: RIGHT SHOULDER - 2+ VIEW COMPARISON:  09/07/2016 right shoulder radiographs. FINDINGS: Mildly comminuted humeral neck fracture with approximately 1/2 shaft's width medial displacement of the shaft relative to the head unchanged from prior radiographs. No shoulder joint dislocation. IMPRESSION: Mildly displaced and comminuted right humeral neck fracture grossly unchanged from prior radiographs. No joint dislocation. Electronically Signed   By: Mitzi HansenLance  Furusawa-Stratton M.D.   On: 09/17/2016 22:34   Ct Head Wo Contrast  Result Date: 09/17/2016 CLINICAL DATA:  Ataxia. EXAM: CT HEAD WITHOUT CONTRAST TECHNIQUE: Contiguous axial images were obtained from the base of the skull through the vertex without intravenous contrast. COMPARISON:  Head CT 03/09/2013 FINDINGS: Brain: No evidence of acute infarction, hemorrhage, hydrocephalus, extra-axial collection or mass lesion/mass effect. Stable generalized cerebral and cerebellar atrophy. Stable chronic small vessel ischemia. Vascular: Atherosclerosis of skullbase vasculature without hyperdense vessel  or abnormal calcification. Skull: No skull fracture or focal lesion. Sinuses/Orbits: Paranasal sinuses and mastoid air cells are clear. The visualized orbits are unremarkable. Other: None. IMPRESSION: No acute intracranial abnormality. Stable atrophy and chronic small vessel ischemia. Electronically Signed   By: Rubye Oaks M.D.   On: 09/17/2016 22:46     Medications:   . sodium chloride 125  mL/hr at 09/19/16 0509   . heparin  5,000 Units Subcutaneous Q8H  . sodium hypochlorite   Irrigation BID  . thiamine  100 mg Intravenous Daily   acetaminophen **OR** acetaminophen, HYDROmorphone (DILAUDID) injection, insulin aspart, ondansetron **OR** ondansetron (ZOFRAN) IV  Assessment/ Plan:   Acute oliguric renal injury with suprapubic tenderness  I suspect obstruction although certainly could be ATN although I see no shock or hypotension and apart from NSAIDS there is no use of nephrotoxic agents. He had a great output after the placement of the foley catheter . Labs pending   HTN controlled  No edema  Bones will follow calcium and phosphorus  Anemia will evaluate for ESA    LOS: 1 Shaun Pena @TODAY @9 :12 AM

## 2016-09-19 NOTE — Progress Notes (Signed)
Initial Nutrition Assessment  DOCUMENTATION CODES:   Severe malnutrition in context of chronic illness, Underweight  INTERVENTION:   Diet advancement per MD Once diet advanced, recommend Boost Breeze po TID, each supplement provides 250 kcal and 9 grams of protein RD will continue to monitor for plan  NUTRITION DIAGNOSIS:   Malnutrition(severe) related to chronic illness, lethargy/confusion (dementia) as evidenced by severe depletion of muscle mass, severe depletion of body fat.  GOAL:   Patient will meet greater than or equal to 90% of their needs  MONITOR:   Diet advancement, Labs, Weight trends, I & O's  REASON FOR ASSESSMENT:   Consult Assessment of nutrition requirement/status  ASSESSMENT:   83 y.o. male with a past medical history significant for NIDDM no longer on meds, alcoholism, dementia and recent humerus fracture who presents with sacral ulcer and found to have renal failure.  Patient in room with no family at bedside. Pt with AMS and unable to provide history. Per chart review, pt with history of ETOH use. Decline began 3 weeks ago when he fell and broke his arm while inebriated. Per caretakers, pt has not been eating or drinking much during this time and having choking episodes on foods. SLP to evaluate.   Weight records indicate the same exact weight since 5/27. Suspect this is inaccurate given that patient has not eaten during this time. Nutrition-Focused physical exam completed. Findings are severe fat depletion, severe muscle depletion, and no edema.   Medications: IV Thiamine daily Labs reviewed: CBGs: 104-112 Elevated BUN GFR: 3  Diet Order:  Diet NPO time specified  Skin:  Wound (see comment) (Stage II sacral ulcer, DTIs on wrist and hip)  Last BM:  6/19  Height:   Ht Readings from Last 1 Encounters:  09/18/16 6' 3.5" (1.918 m)    Weight:   Wt Readings from Last 1 Encounters:  09/18/16 145 lb (65.8 kg)    Ideal Body Weight:  91.8  kg  BMI:  Body mass index is 17.88 kg/m.  Estimated Nutritional Needs:   Kcal:  1800-2000  Protein:  85-95g  Fluid:  2L/day  EDUCATION NEEDS:   No education needs identified at this time  Tilda FrancoLindsey Benson Porcaro, MS, RD, LDN Pager: [phone] After Hours Pager: [phone]
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
## 2016-09-19 NOTE — Evaluation (Signed)
Occupational Therapy Evaluation Patient Details Name: Shaun Pena MRN: [id number] DOB: 04-19-1933 Today's Date: 09/19/2016    History of Present Illness Pt was admitted with ulcers and found to have renal failure.  PMH:  NIDDM, ETOH, mild dementia and mild displaced comminuted humeral neck fx (R) (xrays 5/27 and 6/9)   Clinical Impression   This 83 year old man was admitted for the above.  He was cooperative and tried all activities presented.  Pt has had a recent decline in function and feel that pt would benefit from continued therapy in acute care as well as follow up therapy in SNF. Goals in acute will focus on sitting balance, strengthening LUE, and bed mobility as precursors for transfers and further ADLs    Follow Up Recommendations  SNF    Equipment Recommendations  None recommended by OT (at current time due to decreased mobility)    Recommendations for Other Services       Precautions / Restrictions Precautions Precautions: Shoulder;Fall Precaution Comments: requested sling.  Per chart, pt has not been tolerating.  Used when moving pt Restrictions Weight Bearing Restrictions: Yes Other Position/Activity Restrictions: NWB      Mobility Bed Mobility Overal bed mobility: Needs Assistance Bed Mobility: Rolling;Supine to Sit;Sit to Supine Rolling: Total assist;+2 for physical assistance   Supine to sit: Total assist;+2 for physical assistance Sit to supine: Total assist;+2 for physical assistance   General bed mobility comments: pt initiated moving legs to EOB and trunk down to bed, but performs less than 25% of activity.  Used pad to assist with bed mobility and requested and used sling to support RUE.  Left sling strap off secondary to notes that he wasn't tolerating this  Transfers                 General transfer comment: unable at this time    Balance Overall balance assessment: History of Falls;Needs assistance   Sitting balance-Leahy Scale: Poor                                     ADL either performed or assessed with clinical judgement   ADL Overall ADL's : Needs assistance/impaired Eating/Feeding: NPO   Grooming: Wash/dry face;Minimal assistance;Bed level   Upper Body Bathing: Maximal assistance;Bed level   Lower Body Bathing: Total assistance;+2 for physical assistance;Bed level   Upper Body Dressing : Maximal assistance;Bed level   Lower Body Dressing: Total assistance;+2 for physical assistance;Bed level   Toilet Transfer: Total assistance;+2 for physical assistance (bed pan/catheter)   Toileting- Clothing Manipulation and Hygiene: Total assistance;+2 for physical assistance;Bed level         General ADL Comments: pt cooperative and tried all activities.  Limited mobility. Sat EOB x 1 1/2 minutes then requested to lie down; everything was hurting.       Vision         Perception     Praxis      Pertinent Vitals/Pain Pain Assessment: Faces Faces Pain Scale: Hurts even more Pain Location: all over Pain Intervention(s): Limited activity within patient's tolerance;Monitored during session;Repositioned;Patient requesting pain meds-RN notified     Hand Dominance     Extremity/Trunk Assessment Upper Extremity Assessment Upper Extremity Assessment: RUE deficits/detail;LUE deficits/detail RUE Deficits / Details: humerus fx  able to move wrist and fingers LUE Deficits / Details: generalized weakness. Able to lift LUE to 80 FF  Communication Communication Communication: No difficulties (soft spoken)   Cognition Arousal/Alertness: Awake/alert Behavior During Therapy: WFL for tasks assessed/performed Overall Cognitive Status: No family/caregiver present to determine baseline cognitive functioning                                 General Comments: mild dementia per chart   General Comments       Exercises     Shoulder Instructions      Home Living  Family/patient expects to be discharged to:: Skilled nursing facility                                 Additional Comments: was living with family      Prior Functioning/Environment Level of Independence: Needs assistance        Comments: recent decline in status. Per chart, ambulated with cane        OT Problem List: Decreased strength;Decreased activity tolerance;Impaired balance (sitting and/or standing);Decreased knowledge of precautions;Pain;Impaired UE functional use      OT Treatment/Interventions: Self-care/ADL training;Therapeutic exercise;DME and/or AE instruction;Therapeutic activities;Patient/family education;Balance training    OT Goals(Current goals can be found in the care plan section) Acute Rehab OT Goals Patient Stated Goal: none stated OT Goal Formulation: With patient Time For Goal Achievement: 09/26/16 Potential to Achieve Goals: Fair ADL Goals Pt Will Perform Grooming: with set-up;bed level Pt/caregiver will Perform Home Exercise Program: Left upper extremity (AROM with supervision 2 sets of 10 shoulder exercises ) Additional ADL Goal #1: pt will perform bed mobility wtih max +2 assistance Additional ADL Goal #2: pt will sit unsupported EOB with min guard assist x 3 minutes in preparation for transfers  OT Frequency: Min 2X/week   Barriers to D/C:            Co-evaluation PT/OT/SLP Co-Evaluation/Treatment: Yes Reason for Co-Treatment: For patient/therapist safety;Complexity of the patient's impairments (multi-system involvement) PT goals addressed during session: Mobility/safety with mobility;Balance OT goals addressed during session: ADL's and self-care      AM-PAC PT "6 Clicks" Daily Activity     Outcome Measure Help from another person eating meals?: A Little Help from another person taking care of personal grooming?: A Little Help from another person toileting, which includes using toliet, bedpan, or urinal?: Total Help from  another person bathing (including washing, rinsing, drying)?: A Lot Help from another person to put on and taking off regular upper body clothing?: A Lot Help from another person to put on and taking off regular lower body clothing?: Total 6 Click Score: 12   End of Session    Activity Tolerance: Patient limited by fatigue;Patient limited by pain Patient left: in bed;with call bell/phone within reach;with bed alarm set  OT Visit Diagnosis: Repeated falls (R29.6);Pain Pain - Right/Left: Right Pain - part of body: Shoulder                Time: [id number] OT Time Calculation (min): 34 min Charges:  OT General Charges $OT Visit: 1 Procedure OT Evaluation $OT Eval Low Complexity: 1 Procedure G-Codes:     CalwaMaryellen Lilya Smitherman, OTR/L [phone] 09/19/2016  Lucas Winograd 09/19/2016, 12:34 PM

## 2016-09-20 ENCOUNTER — Inpatient Hospital Stay (HOSPITAL_COMMUNITY): Payer: Medicare Other

## 2016-09-20 LAB — BASIC METABOLIC PANEL
Anion gap: 5 (ref 5–15)
BUN: 38 mg/dL — AB (ref 6–20)
CHLORIDE: 121 mmol/L — AB (ref 101–111)
CO2: 31 mmol/L (ref 22–32)
Calcium: 9.1 mg/dL (ref 8.9–10.3)
Creatinine, Ser: 0.86 mg/dL (ref 0.61–1.24)
GFR calc Af Amer: >60 mL/min (ref >60)
GFR calc non Af Amer: >60 mL/min (ref >60)
GLUCOSE: 243 mg/dL — AB (ref 65–99)
POTASSIUM: 3.3 mmol/L — AB (ref 3.5–5.1)
Sodium: 157 mmol/L — ABNORMAL HIGH (ref 135–145)

## 2016-09-20 LAB — GLUCOSE, CAPILLARY
GLUCOSE-CAPILLARY: 179 mg/dL — AB (ref 65–99)
GLUCOSE-CAPILLARY: 137 mg/dL — AB (ref 65–99)
Glucose-Capillary: 140 mg/dL — ABNORMAL HIGH (ref 65–99)
Glucose-Capillary: 203 mg/dL — ABNORMAL HIGH (ref 65–99)
Glucose-Capillary: 225 mg/dL — ABNORMAL HIGH (ref 65–99)

## 2016-09-20 LAB — CBC
HEMATOCRIT: 29.8 % — AB (ref 39.0–52.0)
Hemoglobin: 9.6 g/dL — ABNORMAL LOW (ref 13.0–17.0)
MCH: 22.9 pg — AB (ref 26.0–34.0)
MCHC: 32.2 g/dL (ref 30.0–36.0)
MCV: 71 fL — AB (ref 78.0–100.0)
PLATELETS: 200 10*3/uL (ref 150–400)
RBC: 4.2 MIL/uL — ABNORMAL LOW (ref 4.22–5.81)
RDW: 16.5 % — AB (ref 11.5–15.5)
WBC: 17.2 10*3/uL — ABNORMAL HIGH (ref 4.0–10.5)

## 2016-09-20 LAB — CK: Total CK: 191 U/L (ref 49–397)

## 2016-09-20 MED ORDER — LORAZEPAM 2 MG/ML IJ SOLN: 1.0000 mg | INTRAMUSCULAR | Status: DC | PRN

## 2016-09-20 MED ORDER — POTASSIUM CHLORIDE 2 MEQ/ML IV SOLN
INTRAVENOUS | Status: DC
  Administered 2016-09-20 – 2016-09-21 (×2): via INTRAVENOUS
  Filled 2016-09-20 (×4): qty 1000

## 2016-09-20 MED ORDER — LIP MEDEX EX OINT
TOPICAL_OINTMENT | CUTANEOUS | Status: AC
  Administered 2016-09-20: 20:00:00
  Filled 2016-09-20: qty 7

## 2016-09-20 MED ORDER — HYDROMORPHONE HCL 1 MG/ML IJ SOLN
1.0000 mg | INTRAMUSCULAR | Status: DC | PRN
  Administered 2016-09-20: 1 mg via INTRAVENOUS
  Filled 2016-09-20: qty 1

## 2016-09-20 MED ORDER — HYDROMORPHONE HCL 1 MG/ML IJ SOLN
1.0000 mg | INTRAMUSCULAR | Status: DC | PRN
  Administered 2016-09-20 – 2016-09-21 (×4): 1 mg via INTRAVENOUS
  Filled 2016-09-20 (×4): qty 1

## 2016-09-20 NOTE — Progress Notes (Signed)
Subjective:  Made about 8 liters of urine and crt down to NORMAL !! Sodium very high- giving d5W IV Objective Vital signs in last 24 hours: Vitals:   09/19/16 0508 09/19/16 1519 09/19/16 2148 09/20/16 0531  BP: (!) 119/56 (!) 155/66 (!) 150/63 (!) 141/73  Pulse: 96 91 90 89  Resp: 18 18 16 16   Temp: 97.8 F (36.6 C) 98.2 F (36.8 C) 98 F (36.7 C) 98.3 F (36.8 C)  TempSrc: Axillary Axillary Axillary Axillary  SpO2: 100% 100% 100% 100%  Weight:      Height:       Weight change:   Intake/Output Summary (Last 24 hours) at 09/20/16 1440 Last data filed at 09/20/16 0701  Gross per 24 hour  Intake             1500 ml  Output             2800 ml  Net            -1300 ml    Assessment/ Plan: Pt is a 83 y.o. yo male who was admitted on 09/17/2016 with  AKI due to BOO  Assessment/Plan: 1. AKI- must have been due to BOO-  foley cath has cured him- will need urology input regarding definitive treatment 2. Hypernatremia- in the setting of post obstructive diuresis and inability to concentrate urine- repleting free water as you are doing- as long as is trending down is Ok- if goes back up give more water  3. Anemia- hgb dropping- supportive care  4. HTN/vol- even though has put out tons of urine- BP did not drop- giving ivf to replace loses   Renal will sign off , call with questions  Shuna Tabor A    Labs: Basic Metabolic Panel:  Recent Labs Lab 09/18/16 0443 09/19/16 1123 09/20/16 1126  NA 141 161* 157*  K 4.6 3.5 3.3*  CL 96* 122* 121*  CO2 21* 25 31  GLUCOSE 147* 117* 243*  BUN 168* 90* 38*  CREATININE 14.62* 2.33* 0.86  CALCIUM 8.9 9.1 9.1   Liver Function Tests:  Recent Labs Lab 09/17/16 2231 09/18/16 0443  AST 29 24  ALT 18 15*  ALKPHOS 51 45  BILITOT 1.1  1.4* 1.1  PROT 7.2 6.4*  ALBUMIN 3.1* 2.8*   No results for input(s): LIPASE, AMYLASE in the last 168 hours. No results for input(s): AMMONIA in the last 168 hours. CBC:  Recent Labs Lab  09/17/16 2231 09/18/16 0443 09/20/16 1126  WBC 18.8* 18.9* 17.2*  NEUTROABS 16.2*  --   --   HGB 11.4* 10.5* 9.6*  HCT 32.8* 30.3* 29.8*  MCV 65.6* 66.3* 71.0*  PLT 167 159 200   Cardiac Enzymes:  Recent Labs Lab 09/17/16 2231 09/18/16 0443 09/20/16 1126  CKTOTAL 822* 712* 191   CBG:  Recent Labs Lab 09/19/16 1831 09/19/16 2027 09/20/16 0005 09/20/16 0412 09/20/16 1029  GLUCAP 176* 158* 140* 179* 203*    Iron Studies:  Recent Labs  09/18/16 0443  IRON 29*  TIBC 169*  FERRITIN 1,011*   Studies/Results: Dg Swallowing Func-speech Pathology  Result Date: 09/20/2016 Objective Swallowing Evaluation: Type of Study: MBS-Modified Barium Swallow Study Patient Details Name: Shaun Pena MRN: [id number] Date of Birth: [birth year] Today's Date: 09/20/2016 Time: SLP Start Time (ACUTE ONLY): 0907-SLP Stop Time (ACUTE ONLY): 0939 SLP Time Calculation (min) (ACUTE ONLY): 32 min Past Medical History: Past Medical History: Diagnosis Date . Anemia   microcytic with MCV = 72 and Hg/Hct WNL .  Diabetes mellitus  . Hyperlipidemia  Past Surgical History: Past Surgical History: Procedure Laterality Date . NO PAST SURGERIES   HPI: 83 y.o. male with a past medical history significant for NIDDM no longer on meds, alcoholism, dementia and recent humerus fracture who presents with sacral ulcer and found to have renal failure. Subjective: pt awake in bed Assessment / Plan / Recommendation CHL IP CLINICAL IMPRESSIONS 09/20/2016 Clinical Impression Pt presents with severe sensorimotor-based oropharyngeal dysphagia and was tested swallowing liquids (nectar, thin) only.   He was observed to cough/expectorate viscous light brown secretions frequently prior to swallowing barium.  Oral weakness/discoordination along with cogntiive deficit results in lingual pumping, oral holding and premature spillage of barium into pharynx.   Pt swallowed only 4 times of approximately 16 trials - 25% of attempts and expectorated  remaining boluses.  He largely orally held boluses and expectorated despite SlP use of sugar to improve palatability, verbal/visual cues to swallow, and dry spoon stimulation.  Pharyngeal swallow was nearly absent muscular contraction reuslting in 90% retention in pharynx.  Pt did not sense gross residuals - and residuals mix with secretions at pyrform sinus.  Pt did not elicit dry swallows to clear residuals but cues to "hock" and expectorate were helpful.  At this time, due to pt's level of dysphagia, he is likely aspirating his secretions.  Recommend consider palliative referral to aid in establishment of goals of care given severe level of dysphagia.  Will follow up for family education re: importance of oral care and to follow pt clinically for possible readiness for repeat swallow test.  SLP Visit Diagnosis Dysphagia, oropharyngeal phase (R13.12) Attention and concentration deficit following -- Frontal lobe and executive function deficit following -- Impact on safety and function Severe aspiration risk;Risk for inadequate nutrition/hydration   CHL IP TREATMENT RECOMMENDATION 09/20/2016 Treatment Recommendations Defer until completion of intrumental exam   Prognosis 09/20/2016 Prognosis for Safe Diet Advancement Guarded Barriers to Reach Goals Severity of deficits Barriers/Prognosis Comment -- CHL IP DIET RECOMMENDATION 09/20/2016 SLP Diet Recommendations NPO;Other (Comment) Liquid Administration via -- Medication Administration Via alternative means Compensations -- Postural Changes --   CHL IP OTHER RECOMMENDATIONS 09/20/2016 Recommended Consults -- Oral Care Recommendations Oral care QID Other Recommendations --   CHL IP FOLLOW UP RECOMMENDATIONS 09/20/2016 Follow up Recommendations Other (comment)   CHL IP FREQUENCY AND DURATION 09/20/2016 Speech Therapy Frequency (ACUTE ONLY) min 2x/week Treatment Duration 1 week      CHL IP ORAL PHASE 09/20/2016 Oral Phase Impaired Oral - Pudding Teaspoon -- Oral - Pudding Cup  -- Oral - Honey Teaspoon -- Oral - Honey Cup -- Oral - Nectar Teaspoon Weak lingual manipulation;Lingual pumping;Reduced posterior propulsion;Holding of bolus;Delayed oral transit;Decreased bolus cohesion;Premature spillage Oral - Nectar Cup Weak lingual manipulation;Lingual pumping;Reduced posterior propulsion;Holding of bolus;Delayed oral transit;Decreased bolus cohesion;Premature spillage Oral - Nectar Straw -- Oral - Thin Teaspoon Weak lingual manipulation;Reduced posterior propulsion;Holding of bolus;Delayed oral transit;Decreased bolus cohesion;Lingual pumping Oral - Thin Cup Weak lingual manipulation;Lingual pumping;Reduced posterior propulsion;Delayed oral transit;Decreased bolus cohesion;Premature spillage Oral - Thin Straw Weak lingual manipulation;Reduced posterior propulsion;Delayed oral transit;Lingual pumping Oral - Puree -- Oral - Mech Soft -- Oral - Regular -- Oral - Multi-Consistency -- Oral - Pill -- Oral Phase - Comment --  CHL IP PHARYNGEAL PHASE 09/20/2016 Pharyngeal Phase Impaired Pharyngeal- Pudding Teaspoon -- Pharyngeal -- Pharyngeal- Pudding Cup -- Pharyngeal -- Pharyngeal- Honey Teaspoon -- Pharyngeal -- Pharyngeal- Honey Cup -- Pharyngeal -- Pharyngeal- Nectar Teaspoon Reduced pharyngeal peristalsis;Reduced epiglottic inversion;Reduced anterior laryngeal  mobility;Reduced laryngeal elevation;Reduced airway/laryngeal closure;Reduced tongue base retraction;Pharyngeal residue - valleculae;Pharyngeal residue - pyriform Pharyngeal -- Pharyngeal- Nectar Cup NT Pharyngeal -- Pharyngeal- Nectar Straw -- Pharyngeal -- Pharyngeal- Thin Teaspoon NT Pharyngeal -- Pharyngeal- Thin Cup Reduced pharyngeal peristalsis;Reduced epiglottic inversion;Reduced anterior laryngeal mobility;Reduced laryngeal elevation;Reduced airway/laryngeal closure;Reduced tongue base retraction;Penetration/Aspiration before swallow;Penetration/Apiration after swallow;Trace aspiration;Pharyngeal residue - valleculae;Pharyngeal  residue - pyriform Pharyngeal Material enters airway, passes BELOW cords without attempt by patient to eject out (silent aspiration) Pharyngeal- Thin Straw NT Pharyngeal -- Pharyngeal- Puree -- Pharyngeal -- Pharyngeal- Mechanical Soft -- Pharyngeal -- Pharyngeal- Regular -- Pharyngeal -- Pharyngeal- Multi-consistency -- Pharyngeal -- Pharyngeal- Pill -- Pharyngeal -- Pharyngeal Comment --  Donavan Burnetamara Kimball, MS [facility] SLP [phone]             Medications: Infusions: . dextrose 125 mL/hr at 09/20/16 1416    Scheduled Medications: . heparin  5,000 Units Subcutaneous Q8H  . insulin aspart  0-9 Units Subcutaneous Q4H  . sodium hypochlorite   Irrigation BID  . thiamine  100 mg Intravenous Daily    have reviewed scheduled and prn medications.  Physical Exam: General: thin- grabbing at iv pole-  Heart: RRR Lungs: mostly clear Abdomen: soft Extremities: no edema Foley will much bloody urine   09/20/2016,2:41 PM  LOS: 2 days
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
## 2016-09-20 NOTE — Progress Notes (Addendum)
Modified Barium Swallow Progress Note  Patient Details  Name: Shaun PiedraJames W Petsch MRN: [id number] Date of Birth: 1933/10/23  Today's Date: 09/20/2016  Modified Barium Swallow completed.  Full report located under Chart Review in the Imaging Section.  Brief recommendations include the following:  Clinical Impression  Pt presents with GROSS sensorimotor-based oropharyngeal dysphagia and was tested swallowing liquids (nectar, thin) only with nearly absent pharyngeal muscle contraction.   He was observed to cough/expectorate viscous light brown secretions frequently prior to swallowing barium.    Oral weakness/discoordination along with cogntiive deficit results in lingual pumping, oral holding and premature spillage of barium into pharynx.   Pt swallowed only 4 times of approximately 16 trials - 25% of attempts and expectorated remaining boluses.  He largely orally held boluses and expectorated despite SlP use of sugar to improve palatability, verbal/visual cues to swallow, and dry spoon stimulation.    Pharyngeal swallow was nearly absent muscular contraction reuslting in 90% retention in pharynx.  Pt did not sense gross residuals - and residuals mix with secretions at pyrform sinus.  Thicker consistencies resulted in worsening residuals. Pt did not elicit dry swallows to clear residuals with max cues but cues to "hock" and expectorate were helpful.    At this time, due to pt's level of dysphagia, he is likely aspirating his secretions.  Recommend consider palliative referral to aid in establishment of goals of care given severe level of dysphagia.  Will follow up for family education re: importance of oral care and to follow pt clinically for possible readiness for repeat swallow test.    Swallow Evaluation Recommendations       SLP Diet Recommendations: NPO;Other (Comment) (consider small single ice chips after oral care for comfort only)       Medication Administration: Via alternative  means               Oral Care Recommendations: Oral care QID (set up suction in room for pt use)        Mills KollerKimball, Mylo Choi Ann Liyah Higham, MS [facility] SLP [phone]  Note:  Twice during MBS, pt rolled his eyes upward and moved his right eye laterally while keeping left eye straight - this occurred twice and he denies this being intentional.  ? Source ? He did report anxiety/stress during its occurrence.

## 2016-09-21 ENCOUNTER — Inpatient Hospital Stay (HOSPITAL_COMMUNITY): Payer: Medicare Other

## 2016-09-21 DIAGNOSIS — F1020 Alcohol dependence, uncomplicated: Secondary | ICD-10-CM

## 2016-09-21 DIAGNOSIS — R1310 Dysphagia, unspecified: Secondary | ICD-10-CM

## 2016-09-21 DIAGNOSIS — N179 Acute kidney failure, unspecified: Secondary | ICD-10-CM

## 2016-09-21 DIAGNOSIS — E119 Type 2 diabetes mellitus without complications: Secondary | ICD-10-CM

## 2016-09-21 DIAGNOSIS — S42291D Other displaced fracture of upper end of right humerus, subsequent encounter for fracture with routine healing: Secondary | ICD-10-CM

## 2016-09-21 DIAGNOSIS — D509 Iron deficiency anemia, unspecified: Secondary | ICD-10-CM

## 2016-09-21 DIAGNOSIS — E43 Unspecified severe protein-calorie malnutrition: Secondary | ICD-10-CM

## 2016-09-21 DIAGNOSIS — Z7189 Other specified counseling: Secondary | ICD-10-CM

## 2016-09-21 DIAGNOSIS — Z515 Encounter for palliative care: Secondary | ICD-10-CM

## 2016-09-21 DIAGNOSIS — L89150 Pressure ulcer of sacral region, unstageable: Secondary | ICD-10-CM

## 2016-09-21 LAB — BASIC METABOLIC PANEL
ANION GAP: 8 (ref 5–15)
Anion gap: 10 (ref 5–15)
BUN: 19 mg/dL (ref 6–20)
BUN: 19 mg/dL (ref 6–20)
CHLORIDE: 116 mmol/L — AB (ref 101–111)
CHLORIDE: 118 mmol/L — AB (ref 101–111)
CO2: 31 mmol/L (ref 22–32)
CO2: 27 mmol/L (ref 22–32)
CREATININE: 0.77 mg/dL (ref 0.61–1.24)
CREATININE: 0.79 mg/dL (ref 0.61–1.24)
Calcium: 9.1 mg/dL (ref 8.9–10.3)
Calcium: 9.1 mg/dL (ref 8.9–10.3)
GFR calc Af Amer: >60 mL/min (ref >60)
GFR calc non Af Amer: >60 mL/min (ref >60)
GFR calc non Af Amer: >60 mL/min (ref >60)
GLUCOSE: 139 mg/dL — AB (ref 65–99)
Glucose, Bld: 160 mg/dL — ABNORMAL HIGH (ref 65–99)
POTASSIUM: 3.7 mmol/L (ref 3.5–5.1)
POTASSIUM: 4.8 mmol/L (ref 3.5–5.1)
SODIUM: 155 mmol/L — AB (ref 135–145)
SODIUM: 155 mmol/L — AB (ref 135–145)

## 2016-09-21 LAB — GLUCOSE, CAPILLARY
GLUCOSE-CAPILLARY: 168 mg/dL — AB (ref 65–99)
Glucose-Capillary: 147 mg/dL — ABNORMAL HIGH (ref 65–99)
Glucose-Capillary: 148 mg/dL — ABNORMAL HIGH (ref 65–99)

## 2016-09-21 LAB — CBC
HEMATOCRIT: 26 % — AB (ref 39.0–52.0)
HEMOGLOBIN: 8.4 g/dL — AB (ref 13.0–17.0)
MCH: 23.4 pg — AB (ref 26.0–34.0)
MCHC: 32.3 g/dL (ref 30.0–36.0)
MCV: 72.4 fL — AB (ref 78.0–100.0)
Platelets: 174 10*3/uL (ref 150–400)
RBC: 3.59 MIL/uL — ABNORMAL LOW (ref 4.22–5.81)
RDW: 17.2 % — AB (ref 11.5–15.5)
WBC: 18.3 10*3/uL — ABNORMAL HIGH (ref 4.0–10.5)

## 2016-09-21 LAB — MAGNESIUM: Magnesium: 1.8 mg/dL (ref 1.7–2.4)

## 2016-09-21 MED ORDER — GLYCOPYRROLATE 1 MG PO TABS
1.0000 mg | ORAL_TABLET | ORAL | Status: DC | PRN
  Filled 2016-09-21: qty 1

## 2016-09-21 MED ORDER — SODIUM CHLORIDE 0.9 % IV SOLN
0.4000 mg/h | INTRAVENOUS | Status: DC
  Administered 2016-09-21: 0.4 mg/h via INTRAVENOUS
  Filled 2016-09-21: qty 5

## 2016-09-21 MED ORDER — POLYVINYL ALCOHOL 1.4 % OP SOLN
1.0000 [drp] | Freq: Four times a day (QID) | OPHTHALMIC | Status: DC | PRN
  Filled 2016-09-21: qty 15

## 2016-09-21 MED ORDER — HALOPERIDOL 0.5 MG PO TABS
0.5000 mg | ORAL_TABLET | ORAL | Status: DC | PRN
  Filled 2016-09-21: qty 1

## 2016-09-21 MED ORDER — GLYCOPYRROLATE 0.2 MG/ML IJ SOLN
0.2000 mg | INTRAMUSCULAR | Status: DC | PRN
Start: 2016-09-21 — End: 2016-09-22
  Filled 2016-09-21: qty 1

## 2016-09-21 MED ORDER — BISACODYL 10 MG RE SUPP: 10.0000 mg | Freq: Every day | RECTAL | Status: DC | PRN

## 2016-09-21 MED ORDER — HALOPERIDOL LACTATE 2 MG/ML PO CONC
0.5000 mg | ORAL | Status: DC | PRN
  Filled 2016-09-21: qty 0.3

## 2016-09-21 MED ORDER — HALOPERIDOL LACTATE 5 MG/ML IJ SOLN: 0.5000 mg | INTRAMUSCULAR | Status: DC | PRN

## 2016-09-21 MED ORDER — BIOTENE DRY MOUTH MT LIQD: 15.0000 mL | OROMUCOSAL | Status: DC | PRN

## 2016-09-21 MED ORDER — HYDROMORPHONE BOLUS VIA INFUSION
1.0000 mg | INTRAVENOUS | Status: DC | PRN
  Administered 2016-09-22 (×2): 1 mg via INTRAVENOUS
  Filled 2016-09-21: qty 1

## 2016-09-21 NOTE — Progress Notes (Signed)
I had a family meeting with Mr. Shaun Pena's family.  Plan moving forward is for full comfort.  Orders Placed for comfort care. I placed him on continuous dilaudid infusion due to uncontrolled pain from humerous fracture.  Full note to follow.  D/w Dr. Virginia CrewsGrunz  Mileah Hemmer, MD [facility] Palliative Medicine Team [phone]

## 2016-09-21 NOTE — Consult Note (Signed)
Consultation Note Date: 09/21/2016   Patient Name: Shaun Pena  DOB: January 10, 1934  MRN: [id number]  Age / Sex: 83 y.o., male  PCP: Patient, No Pcp Per Referring Physician: Patrecia Pour, MD  Reason for Consultation: Establishing goals of care, Non pain symptom management and Pain control  HPI/Patient Profile: 83 y.o. male  with past medical history of NIDDM, alcoholism, dementia, R humerous fracture admitted on 09/17/2016 with renal failure, dysphagia, hypernatremia.  Palliative consulted for goals of care.   Clinical Assessment and Goals of Care: I met today with his daughter, Thayer Headings.  I also discussed with his daughter, Mariann Laster, via phone.  I had discussed with his daughter, Angela Nevin, lat evening and she expressed that she felt Thayer Headings should act as his surrogate and would defer decisions to her.   Thayer Headings reports that her dad has suffered from continued decline in his nutrition, cognition, and functional status for the last several months to years.  He has been saying that he is "tired" and ready to die.    We discussed clinical course this admission, including significant dysphagia and new development of fever with likelihood of aspiration PNA and that he will continue to aspirate moving forward.  Values and goals of care important to patient and family were attempted to be elicited.  Concepts specific to code status and rehospitalization discussed.  We discussed difference between a aggressive medical intervention path and a palliative, comfort focused care path.    Concept of Hospice discussed  Questions and concerns addressed.   PMT will continue to support holistically.  SUMMARY OF RECOMMENDATIONS   - I have spoke with 3 of his 4 children (the 4th is not available due to incarceration).  Code status changed to DNR/DNI and plan moving forward is for full comfort care.  Angela Nevin has deferred to Thayer Headings to make  decisions, and Mariann Laster is in agreement with Janice's decision to pursue full comfort care in light of his continued decline and the fact that he is going to continue to aspirate regardless of interventions moving forward. - Updated orders for comfort using end of life order set.  He continues to have pain related to humerous fracture.  Plan for initiation of continuous dilaudid to control pain. - I began discussion of residential hospice with his family. Plan to reassess stability for transition to residential hospice once his symptoms are better managed.  Code Status/Advance Care Planning:  DNR- We discussed that in light of multiple chronic medical problems that have worsened with this acute decline, care should be focused on interventions that are focused on his comfort as he approaches the end of his life. I discussed with family regarding heroic interventions at the end-of-life and they agree this would not be in line with prior expressed wishes for a natural death. They were in agreement with changing CODE STATUS to DO NOT RESUSCITATE.   Symptom Management:   Pain: Dilaudid continuous infusion + PRN as needed  Anxiety: ativan as needed  Agitation: haldol as needed  Palliative Prophylaxis:  Frequent Pain Assessment  Additional Recommendations (Limitations, Scope, Preferences):  Full Comfort Care  Psycho-social/Spiritual:   Desire for further Chaplaincy support:yes  Additional Recommendations: Education on Hospice  Prognosis:   < 2 weeks  Discharge Planning: Anticipated Hospital Death vs residential hospice      Primary Diagnoses: Present on Admission: . Acute renal failure (ARF) ([place]) . Alcoholism ([place]) . Protein-calorie malnutrition, severe ([place]) . Proximal humerus fracture . Microcytic anemia   I have reviewed the medical record, interviewed the patient and family, and examined the patient. The following aspects are pertinent.  Past Medical History:  Diagnosis  Date  . Anemia    microcytic with MCV = 72 and Hg/Hct WNL  . Diabetes mellitus   . Hyperlipidemia    Social History   Social History  . Marital status: Married    Spouse name: N/A  . Number of children: N/A  . Years of education: N/A   Social History Main Topics  . Smoking status: Current Every Day Smoker    Packs/day: 1.00  . Smokeless tobacco: Never Used  . Alcohol use No  . Drug use: No  . Sexual activity: Not Asked   Other Topics Concern  . None   Social History Narrative  . None   Family History  Problem Relation Age of Onset  . Diabetes Mellitus II Other   . CAD Other   . Stroke Neg Hx   . Cancer Neg Hx    Scheduled Meds: Continuous Infusions: . HYDROmorphone 0.4 mg/hr (09/21/16 1732)   PRN Meds:.acetaminophen **OR** acetaminophen, antiseptic oral rinse, bisacodyl, glycopyrrolate **OR** glycopyrrolate **OR** glycopyrrolate, haloperidol **OR** haloperidol **OR** haloperidol lactate, HYDROmorphone, HYDROmorphone (DILAUDID) injection, LORazepam, ondansetron **OR** ondansetron (ZOFRAN) IV, polyvinyl alcohol Medications Prior to Admission:  Prior to Admission medications   Medication Sig Start Date End Date Taking? Authorizing Provider  acetaminophen (TYLENOL) 650 MG CR tablet Take 1,950 mg by mouth daily as needed for pain.    Yes [provider]  ibuprofen (ADVIL,MOTRIN) 200 MG tablet Take 400 mg by mouth every 6 (six) hours as needed for mild pain or moderate pain.   Yes [provider]  folic acid (FOLVITE) 1 MG tablet Take 1 tablet (1 mg total) by mouth daily. Patient not taking: Reported on 08/25/2016 03/24/13   Verlee Monte, MD  HYDROcodone-acetaminophen (NORCO) 5-325 MG per tablet Take 1 tablet by mouth every 6 (six) hours as needed for moderate pain. Patient not taking: Reported on 08/25/2016 03/24/13   Verlee Monte, MD  Multiple Vitamin (MULTIVITAMIN WITH MINERALS) TABS tablet Take 1 tablet by mouth daily. Patient not taking: Reported on  08/25/2016 03/24/13   Verlee Monte, MD  potassium chloride SA (K-DUR,KLOR-CON) 20 MEQ tablet Take 1 tablet (20 mEq total) by mouth daily. Patient not taking: Reported on 09/17/2016 09/07/16   Domenic Moras, PA-C  traMADol (ULTRAM) 50 MG tablet Take 1 tablet (50 mg total) by mouth every 6 (six) hours as needed for moderate pain or severe pain. Patient not taking: Reported on 09/17/2016 09/07/16   Domenic Moras, PA-C   No Known Allergies Review of Systems Unable to obtain  Physical Exam General: Frail, cachectic, ill appearing.  HEENT: No bruits, no goiter, no JVD Heart: Regular rate and rhythm. No murmur appreciated. Lungs: Fair air movement, clear Abdomen: Soft, nontender, nondistended, positive bowel sounds.  Ext: No significant edema Skin: Warm and dry  Vital Signs: BP (!) 158/78 (BP Location: Left Arm)   Pulse (!) 104   Temp (!) 100.7 F (  38.2 C) (Axillary)   Resp 16   Ht 6' 3.5" (1.918 m)   Wt 65.8 kg (145 lb) Comment: as of 6/9--will have to have current weight  SpO2 100%   BMI 17.88 kg/m  Pain Assessment: Faces POSS *See Group Information*: S-Acceptable,Sleep, easy to arouse Pain Score: 10-Worst pain ever   SpO2: SpO2: 100 % O2 Device:SpO2: 100 % O2 Flow Rate: .   IO: Intake/output summary:  Intake/Output Summary (Last 24 hours) at 09/21/16 2208 Last data filed at 09/21/16 2112  Gross per 24 hour  Intake          1758.33 ml  Output              225 ml  Net          1533.33 ml    LBM: Last BM Date: 09/20/16 Baseline Weight: Weight: 65.8 kg (145 lb) (as of 6/9--will have to have current weight) Most recent weight: Weight: 65.8 kg (145 lb) (as of 6/9--will have to have current weight)     Palliative Assessment/Data:   Flowsheet Rows     Most Recent Value  Intake Tab  Referral Department  Hospitalist  Unit at Time of Referral  Oncology Unit  Palliative Care Primary Diagnosis  Nephrology  Date Notified  09/20/16  Palliative Care Type  New Palliative care  Reason  for referral  Clarify Goals of Care  Date of Admission  09/17/16  # of days IP prior to Palliative referral  3  Clinical Assessment  Psychosocial & Spiritual Assessment  Palliative Care Outcomes      Time In: 1440 Time Out: 1600 Time Total: 80 Greater than 50%  of this time was spent counseling and coordinating care related to the above assessment and plan.  Signed by: Micheline Rough, MD   Please contact Palliative Medicine Team phone at [phone] for questions and concerns.  For individual provider: See Shea Evans

## 2016-09-21 NOTE — Progress Notes (Signed)
I called and spoke with his daughter, [name].  Plan for meeting at Joycelyn Man3PM.    Giannie Soliday, MD [facility] Team [phone]

## 2016-09-21 NOTE — Progress Notes (Signed)
PROGRESS NOTE  Shaun Pena  ZOX:[id number] DOB: [birth year] DOA: 09/17/2016 PCP: Patient, No Pcp Per   Brief Narrative: Shaun Pena a 83 y.o.malewith a past medical history significant for NIDDM no longer on meds, alcoholism, dementia and recent Rthumerus fracture who was admitted on 6/20/18with sacral ulcer and found to have renal failurewith creatinine above 14. This improved with foley catheterization though he continues to be critically ill with infection and hypernatremia, in severe pain, and with severe dysphagia. Palliative care was consulted for goals of care discussions including discussion of PEG tube vs. comfort care. After discussions with the family, the patient has been made full comfort care. Plan is for observation and consideration of inpatient hospice placement in next 24-48 hours.   Assessment & Plan: Principal Problem:   Acute renal failure (ARF) (HCC) Active Problems:   Microcytic anemia   Alcoholism ([facility])   Protein-calorie malnutrition, severe ([facility])   Proximal humerus fracture   Type 2 diabetes mellitus without complication, without long-term current use of insulin (HCC)   Pressure injury of skin  Acute renal failure: Due to obstruction, resolved with foley (Cr of 14 on admission, now normal). - Continue foley  Leukocytosis: With fever this PM. Suspected sources include urinary (TNTC WBCs on UA at admission), cutaneous (malodorous ulcers), and aspiration pneumonia/pneumonitis. - Initially ordered labs to investigate source, though patient's family agrees that he would not want ongoing interventions. Therefore, will hold antibiotics and halt work up.  Hypernatremia: Na peak 161, improving with replacement of free water without significant improvement in mental status.  - Stop frequent lab draws due to comfort care.  Severe dysphagia: Failed swallow eval (MBS) on 09/20/2016,  family reported episodesof difficulty with swallowing at home.  - No PEG per  discussions with family. - Ok to feed if pt desires this for comfort, though he is high aspiration risk.  Humerus fracture: - Continue arm sling - Analgesia per palliative care  History of NIDDM:Off medications, A1c is 5.9. - No further interventions planned.  Alcoholism:Last drink 3 weeks ago.  - No further investigation or treatment warranted.  Decubitus ulcer:Right trochanter 7cm x 9cm x0cm unstageable black eschar, leaking from under eschar, malodorous. Sacrum ulcer 5cm x 6cm unstageable with a stage III in center of gluteal cleft 2.5cm x 1.5cm x 0,5cm malodorous - Local wound care per WOC recommendations. Turn if needed for comfort.  Severe protein calorie malnutrition: No interventions planned at this time due to comfort care. Comfort feedings as tolerated.  - CSW consulted for APS referral.  Code Status: DNR Family Communication: 3 daughters contacted today by Palliative Care. Disposition Plan: [facility] in next 24-48hrs depending on clinical course.  Consultants:   Palliative care  Nephrology  Procedures:   Foley catheterization  Antimicrobials:  None   Subjective: Pt nonverbal. Nods when asked if he is in pain.  Objective: BP (!) 158/78 (BP Location: Left Arm)   Pulse (!) 104   Temp (!) 100.7 F (38.2 C) (Axillary)   Resp 16   Ht 6' 3.5" (1.918 m)   Wt 65.8 kg (145 lb) Comment: as of 6/9--will have to have current weight  SpO2 100%   BMI 17.88 kg/m   General exam: 83 y.o. male in no distress Respiratory system: Non-labored breathing room air. Clear to auscultation bilaterally.  Cardiovascular system: Regular rate and rhythm. No murmur, rub, or gallop. No JVD, and no pedal edema. Gastrointestinal system: Abdomen soft, non-tender, non-distended, with normoactive bowel sounds. No organomegaly or masses  felt. Central nervous system: Alert and oriented. No focal neurological deficits on limited exam. Extremities: Right arm with limited ROM, pain  on palpation/winces. Skin: Right trochanter dressing c/d/i. Sacral pad dressing c/d/i.  Psychiatry: Judgement and insight appear impaired.   Data Reviewed: I have personally reviewed following labs and imaging studies  CBC:  Recent Labs Lab 09/17/16 2231 09/18/16 0443 09/20/16 1126 09/21/16 0435  WBC 18.8* 18.9* 17.2* 18.3*  NEUTROABS 16.2*  --   --   --   HGB 11.4* 10.5* 9.6* 8.4*  HCT 32.8* 30.3* 29.8* 26.0*  MCV 65.6* 66.3* 71.0* 72.4*  PLT 167 159 200 174   Basic Metabolic Panel:  Recent Labs Lab 09/18/16 0443 09/19/16 1123 09/20/16 1126 09/21/16 0435 09/21/16 1144 09/21/16 1505  NA 141 161* 157*  --  155* 155*  K 4.6 3.5 3.3*  --  3.7 4.8  CL 96* 122* 121*  --  116* 118*  CO2 21* 25 31  --  31 27  GLUCOSE 147* 117* 243*  --  160* 139*  BUN 168* 90* 38*  --  19 19  CREATININE 14.62* 2.33* 0.86  --  0.77 0.79  CALCIUM 8.9 9.1 9.1  --  9.1 9.1  MG  --   --   --  1.8  --   --    GFR: Estimated Creatinine Clearance: 68.5 mL/min (by C-G formula based on SCr of 0.79 mg/dL). Liver Function Tests:  Recent Labs Lab 09/17/16 2231 09/18/16 0443  AST 29 24  ALT 18 15*  ALKPHOS 51 45  BILITOT 1.1  1.4* 1.1  PROT 7.2 6.4*  ALBUMIN 3.1* 2.8*   Cardiac Enzymes:  Recent Labs Lab 09/17/16 2231 09/18/16 0443 09/20/16 1126  CKTOTAL 822* 712* 191   CBG:  Recent Labs Lab 09/20/16 1753 09/20/16 2156 09/21/16 0406 09/21/16 0738 09/21/16 1155  GLUCAP 225* 137* 168* 147* 148*   Urine analysis:    Component Value Date/Time   COLORURINE AMBER (A) 09/18/2016 0845   APPEARANCEUR CLOUDY (A) 09/18/2016 0845   LABSPEC 1.014 09/18/2016 0845   PHURINE 5.0 09/18/2016 0845   GLUCOSEU NEGATIVE 09/18/2016 0845   HGBUR LARGE (A) 09/18/2016 0845   BILIRUBINUR NEGATIVE 09/18/2016 0845   KETONESUR NEGATIVE 09/18/2016 0845   PROTEINUR 100 (A) 09/18/2016 0845   UROBILINOGEN 1.0 03/23/2013 0447   NITRITE NEGATIVE 09/18/2016 0845   LEUKOCYTESUR MODERATE (A) 09/18/2016  0845   Radiology Studies: Dg Swallowing Func-speech Pathology  Result Date: 09/20/2016 Objective Swallowing Evaluation: Type of Study: MBS-Modified Barium Swallow Study Patient Details Name: Shaun Pena MRN: [id number] Date of Birth: 02/21/1934 Today's Date: 09/20/2016 Time: SLP Start Time (ACUTE ONLY): 0907-SLP Stop Time (ACUTE ONLY): 0939 SLP Time Calculation (min) (ACUTE ONLY): 32 min Past Medical History: Past Medical History: Diagnosis Date . Anemia   microcytic with MCV = 72 and Hg/Hct WNL . Diabetes mellitus  . Hyperlipidemia  Past Surgical History: Past Surgical History: Procedure Laterality Date . NO PAST SURGERIES   HPI: 83 y.o. male with a past medical history significant for NIDDM no longer on meds, alcoholism, dementia and recent humerus fracture who presents with sacral ulcer and found to have renal failure. Subjective: pt awake in bed Assessment / Plan / Recommendation CHL IP CLINICAL IMPRESSIONS 09/20/2016 Clinical Impression Pt presents with severe sensorimotor-based oropharyngeal dysphagia and was tested swallowing liquids (nectar, thin) only.   He was observed to cough/expectorate viscous light brown secretions frequently prior to swallowing barium.  Oral weakness/discoordination along with cogntiive deficit  results in lingual pumping, oral holding and premature spillage of barium into pharynx.   Pt swallowed only 4 times of approximately 16 trials - 25% of attempts and expectorated remaining boluses.  He largely orally held boluses and expectorated despite SlP use of sugar to improve palatability, verbal/visual cues to swallow, and dry spoon stimulation.  Pharyngeal swallow was nearly absent muscular contraction reuslting in 90% retention in pharynx.  Pt did not sense gross residuals - and residuals mix with secretions at pyrform sinus.  Pt did not elicit dry swallows to clear residuals but cues to "hock" and expectorate were helpful.  At this time, due to pt's level of dysphagia, he is  likely aspirating his secretions.  Recommend consider palliative referral to aid in establishment of goals of care given severe level of dysphagia.  Will follow up for family education re: importance of oral care and to follow pt clinically for possible readiness for repeat swallow test.  SLP Visit Diagnosis Dysphagia, oropharyngeal phase (R13.12) Attention and concentration deficit following -- Frontal lobe and executive function deficit following -- Impact on safety and function Severe aspiration risk;Risk for inadequate nutrition/hydration   CHL IP TREATMENT RECOMMENDATION 09/20/2016 Treatment Recommendations Defer until completion of intrumental exam   Prognosis 09/20/2016 Prognosis for Safe Diet Advancement Guarded Barriers to Reach Goals Severity of deficits Barriers/Prognosis Comment -- CHL IP DIET RECOMMENDATION 09/20/2016 SLP Diet Recommendations NPO;Other (Comment) Liquid Administration via -- Medication Administration Via alternative means Compensations -- Postural Changes --   CHL IP OTHER RECOMMENDATIONS 09/20/2016 Recommended Consults -- Oral Care Recommendations Oral care QID Other Recommendations --   CHL IP FOLLOW UP RECOMMENDATIONS 09/20/2016 Follow up Recommendations Other (comment)   CHL IP FREQUENCY AND DURATION 09/20/2016 Speech Therapy Frequency (ACUTE ONLY) min 2x/week Treatment Duration 1 week      CHL IP ORAL PHASE 09/20/2016 Oral Phase Impaired Oral - Pudding Teaspoon -- Oral - Pudding Cup -- Oral - Honey Teaspoon -- Oral - Honey Cup -- Oral - Nectar Teaspoon Weak lingual manipulation;Lingual pumping;Reduced posterior propulsion;Holding of bolus;Delayed oral transit;Decreased bolus cohesion;Premature spillage Oral - Nectar Cup Weak lingual manipulation;Lingual pumping;Reduced posterior propulsion;Holding of bolus;Delayed oral transit;Decreased bolus cohesion;Premature spillage Oral - Nectar Straw -- Oral - Thin Teaspoon Weak lingual manipulation;Reduced posterior propulsion;Holding of  bolus;Delayed oral transit;Decreased bolus cohesion;Lingual pumping Oral - Thin Cup Weak lingual manipulation;Lingual pumping;Reduced posterior propulsion;Delayed oral transit;Decreased bolus cohesion;Premature spillage Oral - Thin Straw Weak lingual manipulation;Reduced posterior propulsion;Delayed oral transit;Lingual pumping Oral - Puree -- Oral - Mech Soft -- Oral - Regular -- Oral - Multi-Consistency -- Oral - Pill -- Oral Phase - Comment --  CHL IP PHARYNGEAL PHASE 09/20/2016 Pharyngeal Phase Impaired Pharyngeal- Pudding Teaspoon -- Pharyngeal -- Pharyngeal- Pudding Cup -- Pharyngeal -- Pharyngeal- Honey Teaspoon -- Pharyngeal -- Pharyngeal- Honey Cup -- Pharyngeal -- Pharyngeal- Nectar Teaspoon Reduced pharyngeal peristalsis;Reduced epiglottic inversion;Reduced anterior laryngeal mobility;Reduced laryngeal elevation;Reduced airway/laryngeal closure;Reduced tongue base retraction;Pharyngeal residue - valleculae;Pharyngeal residue - pyriform Pharyngeal -- Pharyngeal- Nectar Cup NT Pharyngeal -- Pharyngeal- Nectar Straw -- Pharyngeal -- Pharyngeal- Thin Teaspoon NT Pharyngeal -- Pharyngeal- Thin Cup Reduced pharyngeal peristalsis;Reduced epiglottic inversion;Reduced anterior laryngeal mobility;Reduced laryngeal elevation;Reduced airway/laryngeal closure;Reduced tongue base retraction;Penetration/Aspiration before swallow;Penetration/Apiration after swallow;Trace aspiration;Pharyngeal residue - valleculae;Pharyngeal residue - pyriform Pharyngeal Material enters airway, passes BELOW cords without attempt by patient to eject out (silent aspiration) Pharyngeal- Thin Straw NT Pharyngeal -- Pharyngeal- Puree -- Pharyngeal -- Pharyngeal- Mechanical Soft -- Pharyngeal -- Pharyngeal- Regular -- Pharyngeal -- Pharyngeal- Multi-consistency -- Pharyngeal -- Pharyngeal- Pill --  Pharyngeal -- Pharyngeal Comment --  Donavan Burnetamara Kimball, MS [facility] SLP [phone]              Scheduled Meds: . heparin  5,000 Units Subcutaneous Q8H  .  insulin aspart  0-9 Units Subcutaneous Q4H  . thiamine  100 mg Intravenous Daily   Continuous Infusions: . dextrose 5 % with kcl 100 mL/hr at 09/21/16 1439     LOS: 3 days   Time spent: 35 minutes.  Hazeline Junkeryan Jacyln Carmer, MD [facility] Pager [phone]  If 7PM-7AM, please contact night-coverage [email or web address] Password [email or web address] 09/21/2016, 4:05 PM

## 2016-09-22 NOTE — Progress Notes (Signed)
Wasted 60 ml of dilaudid drip into sink with Derry Loryindy Millar RN as witness.

## 2016-09-22 NOTE — Progress Notes (Signed)
Daily Progress Note   Patient Name: Shaun Pena       Date: 09/22/2016 DOB: June 08, 1933  Age: 83 y.o. MRN#: [id number] Attending Physician: Shaun Nine, MD Primary Care Physician: Patient, No Pcp Per Admit Date: 09/17/2016  Reason for Consultation/Follow-up: Disposition, Establishing goals of care, Non pain symptom management and Pain control  Subjective: I saw and examined Shaun Pena today. He is less interactive but appears much more comfortable.    He appears to be in early stages of actively dying.  I called and spoke with his daughter Shaun Pena about possible transition to residential hospice.  See below  Length of Stay: 4  Current Medications: Scheduled Meds:    Continuous Infusions: . HYDROmorphone 0.4 mg/hr (09/21/16 1732)    PRN Meds: acetaminophen **OR** acetaminophen, antiseptic oral rinse, bisacodyl, glycopyrrolate **OR** glycopyrrolate **OR** glycopyrrolate, haloperidol **OR** haloperidol **OR** haloperidol lactate, HYDROmorphone, HYDROmorphone (DILAUDID) injection, LORazepam, ondansetron **OR** ondansetron (ZOFRAN) IV, polyvinyl alcohol  Physical Exam    General: Frail, cachectic, ill appearing.  Minimally interactive. HEENT: Dry mucus membranes Heart: Regular rate and rhythm. No murmur appreciated. Lungs: Fair air movement, Scattered coarse Abdomen: Soft, nontender, nondistended, positive bowel sounds.  Ext: No significant edema Skin: Warm and dry       Vital Signs: BP (!) 158/78 (BP Location: Left Arm)   Pulse (!) 104   Temp (!) 100.7 F (38.2 C) (Axillary)   Resp 16   Ht 6' 3.5" (1.918 m)   Wt 65.8 kg (145 lb) Comment: as of 6/9--will have to have current weight  SpO2 100%   BMI 17.88 kg/m  SpO2: SpO2: 100 % O2 Device: O2 Device: Not Delivered O2  Flow Rate:    Intake/output summary:  Intake/Output Summary (Last 24 hours) at 09/22/16 1133 Last data filed at 09/22/16 4098  Gross per 24 hour  Intake           958.33 ml  Output              560 ml  Net           398.33 ml   LBM: Last BM Date: 09/20/16 Baseline Weight: Weight: 65.8 kg (145 lb) (as of 6/9--will have to have current weight) Most recent weight: Weight: 65.8 kg (145 lb) (as of 6/9--will have to have current weight)  Palliative Assessment/Data:    Flowsheet Rows     Most Recent Value  Intake Tab  Referral Department  Hospitalist  Unit at Time of Referral  Oncology Unit  Palliative Care Primary Diagnosis  Nephrology  Date Notified  09/20/16  Palliative Care Type  New Palliative care  Reason for referral  Clarify Goals of Care  Date of Admission  09/17/16  # of days IP prior to Palliative referral  3  Clinical Assessment  Psychosocial & Spiritual Assessment  Palliative Care Outcomes      Patient Active Problem List   Diagnosis Date Noted  . Acute renal failure (ARF) (HCC) 09/18/2016  . Type 2 diabetes mellitus without complication, without long-term current use of insulin (HCC) 09/18/2016  . Pressure injury of skin 09/18/2016  . Proximal humerus fracture 03/24/2013  . Protein-calorie malnutrition, severe (HCC) 03/23/2013  . Weakness 03/22/2013  . Alcoholism (HCC) 03/22/2013  . Microcytic anemia 05/05/2006    Palliative Care Assessment & Plan   Patient Profile: 83 y.o. male  with past medical history of NIDDM, alcoholism, dementia, R humerous fracture admitted on 09/17/2016 with renal failure, dysphagia, hypernatremia.  He is now grossly aspirating and with likely aspiration PNA.  Palliative consulted for goals of care.   Recommendations/Plan: - I spoke with 3 of his 4 children (the 4th is not available due to incarceration) in the past 2 days.  Plan moving forward is for full comfort care.  Discussed residential hospice with his family yesterday  and again with his daughter Shaun Pena today. Plan [facility] to [facility].  I placed SW consult to facilitate this. - Pain/SOB: Appears much more comfortable with initiation of hydromorphone infusion.  Continue same rate with additional PRN as needed to ensure comfort. - Agitation: Haldol as needed - Anxiety: Ativan as needed - Secretions: Robinul as needed   Goals of Care and Additional Recommendations:  Limitations on Scope of Treatment: Full Comfort Care  Code Status:    Code Status Orders        Start     Ordered   09/21/16 1635  Do not attempt resuscitation (DNR)  Continuous    Question Answer Comment  In the event of cardiac or respiratory ARREST Do not call a "code blue"   In the event of cardiac or respiratory ARREST Do not perform Intubation, CPR, defibrillation or ACLS   In the event of cardiac or respiratory ARREST Use medication by any route, position, wound care, and other measures to relive pain and suffering. May use oxygen, suction and manual treatment of airway obstruction as needed for comfort.      09/21/16 1637    Code Status History    Date Active Date Inactive Code Status Order ID Comments User Context   09/21/2016  4:31 PM 09/21/2016  4:37 PM DNR [id number]  Shaun Minus, MD Inpatient   09/18/2016  1:00 AM 09/21/2016  4:31 PM Full Code [id number]  Alberteen Sam, MD Inpatient   03/22/2013 10:56 PM 03/24/2013  5:44 PM Full Code [id number]  Eduard Clos, MD ED       Prognosis:   < 2 weeks.  Shaun Pena has been declining over the last several weeks.  He is now chronically aspirating, is not longer taking nutrition or hydration, and spiked a fever (likely aspiration PNA).  His prognosis is less than 2 weeks.  He is currently on a continuous infusion for pain related to his humerous fracture.  He will best be served by  residential hospice for end of life care.  Discharge Planning:  Hospice facility  Care plan was discussed with daughter, Shaun NixonJanice,  Shaun Pena  Thank you for allowing the Palliative Medicine Team to assist in the care of this patient.   Time In: 1110 Time Out: 1135 Total Time 25 Prolonged Time Billed No      Greater than 50%  of this time was spent counseling and coordinating care related to the above assessment and plan.  Shaun MinusGene Searra Carnathan, MD  Please contact Palliative Medicine Team phone at [phone] for questions and concerns.

## 2016-09-22 NOTE — Progress Notes (Addendum)
[facility]  RN  Received request from Rogers[place]ennaya, [place], for [facility].  Chart reviewed.  Spoke with [name], daughter, to confirm interest and explain services.  Family agreeable to transfer today.  Left message for Janett BillowJennaya, LCSW to advise okay to transfer today, once paperwork is complete.  Registration paperwork to be completed today at 230pm today with daughter.  Dr. Kern Reaponald Hertweck to assume care per family request.  Please fax discharge summary to [phone].  RN, please call report to [phone].  Please arrange transport for patient to arrive as soon as possible.    Thank you for the referral,  Adele Barthelmy Evans, RN, BSN [facility]ison [phone]  All hospital liaisons are now on AMION.    **UPDATE:  Paperwork completed with [name], daughter.   Janett BillowJennaya, LCSW is aware.  [facility] will be called for transport.

## 2016-09-22 NOTE — Progress Notes (Signed)
Patient discharged to [facility], copies of all discharge information sent to facility. Patient ot be transported via [facility].

## 2016-09-22 NOTE — Clinical Social Work Note (Addendum)
CSW received consult for "Residential hospice placement; Family is interested in residential hospice for end of life care. Please work to present options and make referrals as appropriate. Family mentioned [facility] preference due to location."   CSW spoke with Dr. Jari FavreFreeman-pt is stable to discharge to Hospice facility. CSW called dtr-Janice Nicholaus Bloom[facility]elley at [phone] to confirm residential hospice is the selection and to offer choice. [facility]. [facility], who stated she has a bed available today and [facility].   CSW left VM for Dr. Neale BurlyFreeman providing update and also updated Dr. Jarvis NewcomerGrunz. CSW faxed discharge summary to [phone]. CSW arranged [facility] for transportation-spoke with Mr. Jean RosenthalJackson [phone](Badge #[id number]) CSW added RN report ifnroamtion to treatment team sticky note. RN aware of discharge. CSW left VM for Adult Protective Services Social Worker Salome Spottedia Turner at [phone]. Signed DNR on chart. CSW signing off as no further Social Work needs identified.   Corlis HoveJeneya Zanyah Lentsch, LCSWA, LCASA Clinical Social Work Jamelle Rushing(Wkend coverage) [phone]

## 2016-09-22 NOTE — Care Management Important Message (Signed)
Important Message  Patient Details  Name: Royal PiedraJames W Weissmann MRN: [id number] Date of Birth: 1933/05/06   Medicare Important Message Given:  Yes    Elliot CousinShavis, Geremy Rister Ellen, RN 09/22/2016, 3:52 PM

## 2016-09-22 NOTE — Discharge Summary (Addendum)
Physician Discharge Summary  Shaun Pena ZOX:[id number] DOB: 07-29-1933 DOA: 09/17/2016  PCP: Patient, No Pcp Per  Admit date: 09/17/2016 Discharge date: 09/22/2016  Admitted From: Home Disposition: [facility]   Recommendations for Outpatient Follow-up:  1. Comfort care:  1. Haldol tablet 0.5mg  PO/SL q4h prn agitation 2. Dilaudid 0.4mg /hr IV and 1mg  IV q3h prn 3. Robinul 1mg  po q4h prn secretions / 0.2mg  SQ/IV q4h prn secretions  Discharge Condition: Guarded prognosis < 2 weeks. CODE STATUS: DNR Diet recommendation: Currently NPO, as tolerated for comfort  Brief/Interim Summary: Shaun Pena a 83 y.o.malewith a past medical history significant for NIDDM no longer on meds, alcoholism, dementia and recent Rthumerus fracture who was admitted on 6/20/18with sacral ulcer and found to have renal failurewith creatinine above 14. This improved with foley catheterization though he continued to be critically ill with infection and hypernatremia, in severe pain, and with severe dysphagia. Palliative care was consulted for goals of care discussions including discussion of PEG tube vs. comfort care. After discussions with the family, the patient has been made full comfort care. He appears to be actively dying and is comfortable. Plan is to discharge to inpatient hospice.  Discharge Diagnoses:  Principal Problem:   Acute renal failure (ARF) (HCC) Active Problems:   Microcytic anemia   Alcoholism (HCC)   Protein-calorie malnutrition, severe (HCC)   Proximal humerus fracture   Type 2 diabetes mellitus without complication, without long-term current use of insulin (HCC)   Pressure injury of skin  Acute renal failure: Due to obstruction, resolved with foley (Cr of 14 on admission, now normal). - Continue foley as needed for comfort.  Leukocytosis: With fever this PM. Suspected sources include urinary (TNTC WBCs on UA at admission), cutaneous (malodorous ulcers), and  aspiration pneumonia/pneumonitis. - Initially ordered labs to investigate source, though patient's family agrees that he would not want ongoing interventions. Therefore, will hold antibiotics and halt work up.  Hypernatremia: Na peak 161, improved to 155 with replacement of free water without overcorrection. - No further lab draws.  Severe dysphagia: Failed swallow eval (MBS) on 09/20/2016, family reported episodesof difficulty with swallowing at home.  - No PEG per discussions with family. - Ok to feed if pt desires this for comfort, though he is high aspiration risk.  Humerus fracture: - Continue arm sling - Analgesia per palliative care  History of NIDDM:Off medications, A1c is 5.9. - No further interventions planned.  Alcoholism:Last drink 3 weeks ago.  - No further investigation or treatment warranted.  Decubitus ulcer:Right trochanter 7cm x 9cm x0cm unstageable black eschar, leaking from under eschar, malodorous. Sacrum ulcer 5cm x 6cm unstageable with a stage III in center of gluteal cleft 2.5cm x 1.5cm x 0,5cm malodorous - Local wound care per WOC recommendations. Turn if needed for comfort.  Severe protein calorie malnutrition: No interventions planned at this time due to comfort care. Comfort feedings as tolerated.  - CSW was consulted and made APS referral.  Discharge Instructions  Allergies as of 09/22/2016   No Known Allergies     Medication List    STOP taking these medications   acetaminophen 650 MG CR tablet Commonly known as:  TYLENOL   folic acid 1 MG tablet Commonly known as:  FOLVITE   HYDROcodone-acetaminophen 5-325 MG tablet Commonly known as:  NORCO   ibuprofen 200 MG tablet Commonly known as:  ADVIL,MOTRIN   multivitamin with minerals Tabs tablet   potassium chloride SA 20 MEQ tablet Commonly known as:  K-DUR,KLOR-CON   traMADol 50 MG tablet Commonly known as:  ULTRAM       No Known Allergies  Consultations:  Palliative  care  Nephrology  Procedures/Studies: Dg Chest 2 View  Result Date: 09/07/2016 CLINICAL DATA:  Cough.  Fall 1 week ago.  Diabetes.  Smoker. EXAM: CHEST  2 VIEW COMPARISON:  03/22/2013 FINDINGS: Two frontal and 1 lateral radiograph. The lateral view is suboptimal secondary to positioning and resultant artifact. Midline trachea. Normal heart size. Tortuous thoracic aorta. No pleural effusion or pneumothorax. Hyperinflation. Clear lungs. Proximal right humerus fracture. Probable remote proximal left humerus fracture with nonunion. IMPRESSION: Hyperinflation, without acute disease. Bilateral proximal humerus fractures. Left-sided fracture is likely remote. Electronically Signed   By: Jeronimo Greaves M.D.   On: 09/07/2016 17:50   Dg Shoulder Right  Result Date: 09/17/2016 CLINICAL DATA:  83 y/o  M; right shoulder pain. EXAM: RIGHT SHOULDER - 2+ VIEW COMPARISON:  09/07/2016 right shoulder radiographs. FINDINGS: Mildly comminuted humeral neck fracture with approximately 1/2 shaft's width medial displacement of the shaft relative to the head unchanged from prior radiographs. No shoulder joint dislocation. IMPRESSION: Mildly displaced and comminuted right humeral neck fracture grossly unchanged from prior radiographs. No joint dislocation. Electronically Signed   By: Mitzi Hansen M.D.   On: 09/17/2016 22:34   Dg Shoulder Right  Result Date: 09/07/2016 CLINICAL DATA:  Fall 1 week ago, shoulder fracture EXAM: RIGHT SHOULDER - 2+ VIEW COMPARISON:  08/25/2016 FINDINGS: Comminuted fracture of the right humeral neck, now with approximately 1/2 shaft width medial displacement. Mild degenerative changes of the glenohumeral and acromioclavicular joints. Visualized soft tissues are within normal limits. Visualized right lung is clear. IMPRESSION: 1/2 shaft width medial displacement of the known comminuted right humeral neck fracture. Electronically Signed   By: Charline Bills M.D.   On: 09/07/2016 17:48   Dg  Shoulder Right  Result Date: 08/25/2016 CLINICAL DATA:  Fall yesterday with right arm pain, initial encounter EXAM: RIGHT SHOULDER - 2+ VIEW COMPARISON:  06/25/2009 FINDINGS: There is a fracture through the surgical neck with mild impaction at the fracture site. No dislocation is noted. No other focal abnormality is seen. IMPRESSION: Surgical neck fracture of the proximal right humerus. Electronically Signed   By: Alcide Clever M.D.   On: 08/25/2016 20:32   Ct Head Wo Contrast  Result Date: 09/17/2016 CLINICAL DATA:  Ataxia. EXAM: CT HEAD WITHOUT CONTRAST TECHNIQUE: Contiguous axial images were obtained from the base of the skull through the vertex without intravenous contrast. COMPARISON:  Head CT 03/09/2013 FINDINGS: Brain: No evidence of acute infarction, hemorrhage, hydrocephalus, extra-axial collection or mass lesion/mass effect. Stable generalized cerebral and cerebellar atrophy. Stable chronic small vessel ischemia. Vascular: Atherosclerosis of skullbase vasculature without hyperdense vessel or abnormal calcification. Skull: No skull fracture or focal lesion. Sinuses/Orbits: Paranasal sinuses and mastoid air cells are clear. The visualized orbits are unremarkable. Other: None. IMPRESSION: No acute intracranial abnormality. Stable atrophy and chronic small vessel ischemia. Electronically Signed   By: Rubye Oaks M.D.   On: 09/17/2016 22:46   Dg Chest Port 1 View  Result Date: 09/21/2016 CLINICAL DATA:  Fever.  Concern for aspiration versus CHF. EXAM: PORTABLE CHEST 1 VIEW COMPARISON:  September 07, 2016 FINDINGS: The study is limited due to patient positioning. No pneumothorax. Chronic proximal humeral fractures. Apparent infiltrate in the left infrahilar region. No other acute abnormalities. IMPRESSION: Left infrahilar infiltrate suggesting pneumonia or aspiration. Recommend clinical correlation and follow-up to resolution. Electronically Signed   By:  Gerome Samavid  Williams III M.D   On: 09/21/2016 16:26    Dg Swallowing Func-speech Pathology  Result Date: 09/20/2016 Objective Swallowing Evaluation: Type of Study: MBS-Modified Barium Swallow Study Patient Details Name: Royal PiedraJames W Hord MRN: [id number] Date of Birth: [birth year] Today's Date: 09/20/2016 Time: SLP Start Time (ACUTE ONLY): 0907-SLP Stop Time (ACUTE ONLY): 0939 SLP Time Calculation (min) (ACUTE ONLY): 32 min Past Medical History: Past Medical History: Diagnosis Date . Anemia   microcytic with MCV = 72 and Hg/Hct WNL . Diabetes mellitus  . Hyperlipidemia  Past Surgical History: Past Surgical History: Procedure Laterality Date . NO PAST SURGERIES   HPI: [age] y.o. male with a past medical history significant for NIDDM no longer on meds, alcoholism, dementia and recent humerus fracture who presents with sacral ulcer and found to have renal failure. Subjective: pt awake in bed Assessment / Plan / Recommendation CHL IP CLINICAL IMPRESSIONS 09/20/2016 Clinical Impression Pt presents with severe sensorimotor-based oropharyngeal dysphagia and was tested swallowing liquids (nectar, thin) only.   He was observed to cough/expectorate viscous light brown secretions frequently prior to swallowing barium.  Oral weakness/discoordination along with cogntiive deficit results in lingual pumping, oral holding and premature spillage of barium into pharynx.   Pt swallowed only 4 times of approximately 16 trials - 25% of attempts and expectorated remaining boluses.  He largely orally held boluses and expectorated despite SlP use of sugar to improve palatability, verbal/visual cues to swallow, and dry spoon stimulation.  Pharyngeal swallow was nearly absent muscular contraction reuslting in 90% retention in pharynx.  Pt did not sense gross residuals - and residuals mix with secretions at pyrform sinus.  Pt did not elicit dry swallows to clear residuals but cues to "hock" and expectorate were helpful.  At this time, due to pt's level of dysphagia, he is likely aspirating his  secretions.  Recommend consider palliative referral to aid in establishment of goals of care given severe level of dysphagia.  Will follow up for family education re: importance of oral care and to follow pt clinically for possible readiness for repeat swallow test.  SLP Visit Diagnosis Dysphagia, oropharyngeal phase (R13.12) Attention and concentration deficit following -- Frontal lobe and executive function deficit following -- Impact on safety and function Severe aspiration risk;Risk for inadequate nutrition/hydration   CHL IP TREATMENT RECOMMENDATION 09/20/2016 Treatment Recommendations Defer until completion of intrumental exam   Prognosis 09/20/2016 Prognosis for Safe Diet Advancement Guarded Barriers to Reach Goals Severity of deficits Barriers/Prognosis Comment -- CHL IP DIET RECOMMENDATION 09/20/2016 SLP Diet Recommendations NPO;Other (Comment) Liquid Administration via -- Medication Administration Via alternative means Compensations -- Postural Changes --   CHL IP OTHER RECOMMENDATIONS 09/20/2016 Recommended Consults -- Oral Care Recommendations Oral care QID Other Recommendations --   CHL IP FOLLOW UP RECOMMENDATIONS 09/20/2016 Follow up Recommendations Other (comment)   CHL IP FREQUENCY AND DURATION 09/20/2016 Speech Therapy Frequency (ACUTE ONLY) min 2x/week Treatment Duration 1 week      CHL IP ORAL PHASE 09/20/2016 Oral Phase Impaired Oral - Pudding Teaspoon -- Oral - Pudding Cup -- Oral - Honey Teaspoon -- Oral - Honey Cup -- Oral - Nectar Teaspoon Weak lingual manipulation;Lingual pumping;Reduced posterior propulsion;Holding of bolus;Delayed oral transit;Decreased bolus cohesion;Premature spillage Oral - Nectar Cup Weak lingual manipulation;Lingual pumping;Reduced posterior propulsion;Holding of bolus;Delayed oral transit;Decreased bolus cohesion;Premature spillage Oral - Nectar Straw -- Oral - Thin Teaspoon Weak lingual manipulation;Reduced posterior propulsion;Holding of bolus;Delayed oral  transit;Decreased bolus cohesion;Lingual pumping Oral - Thin Cup Weak lingual manipulation;Lingual pumping;Reduced posterior  propulsion;Delayed oral transit;Decreased bolus cohesion;Premature spillage Oral - Thin Straw Weak lingual manipulation;Reduced posterior propulsion;Delayed oral transit;Lingual pumping Oral - Puree -- Oral - Mech Soft -- Oral - Regular -- Oral - Multi-Consistency -- Oral - Pill -- Oral Phase - Comment --  CHL IP PHARYNGEAL PHASE 09/20/2016 Pharyngeal Phase Impaired Pharyngeal- Pudding Teaspoon -- Pharyngeal -- Pharyngeal- Pudding Cup -- Pharyngeal -- Pharyngeal- Honey Teaspoon -- Pharyngeal -- Pharyngeal- Honey Cup -- Pharyngeal -- Pharyngeal- Nectar Teaspoon Reduced pharyngeal peristalsis;Reduced epiglottic inversion;Reduced anterior laryngeal mobility;Reduced laryngeal elevation;Reduced airway/laryngeal closure;Reduced tongue base retraction;Pharyngeal residue - valleculae;Pharyngeal residue - pyriform Pharyngeal -- Pharyngeal- Nectar Cup NT Pharyngeal -- Pharyngeal- Nectar Straw -- Pharyngeal -- Pharyngeal- Thin Teaspoon NT Pharyngeal -- Pharyngeal- Thin Cup Reduced pharyngeal peristalsis;Reduced epiglottic inversion;Reduced anterior laryngeal mobility;Reduced laryngeal elevation;Reduced airway/laryngeal closure;Reduced tongue base retraction;Penetration/Aspiration before swallow;Penetration/Apiration after swallow;Trace aspiration;Pharyngeal residue - valleculae;Pharyngeal residue - pyriform Pharyngeal Material enters airway, passes BELOW cords without attempt by patient to eject out (silent aspiration) Pharyngeal- Thin Straw NT Pharyngeal -- Pharyngeal- Puree -- Pharyngeal -- Pharyngeal- Mechanical Soft -- Pharyngeal -- Pharyngeal- Regular -- Pharyngeal -- Pharyngeal- Multi-consistency -- Pharyngeal -- Pharyngeal- Pill -- Pharyngeal -- Pharyngeal Comment --  Donavan Burnet, MS [facility] SLP [phone]             Subjective: Pt without complaints.   Discharge Exam: Vitals:   09/21/16 0610  09/21/16 1300  BP: 128/64 (!) 158/78  Pulse: 88 (!) 104  Resp: 16 16  Temp: 98.1 F (36.7 C) (!) 100.7 F (38.2 C)   General: Pt is responsive and comfortable-appearing Cardiovascular: RRR, S1/S2 +, no rubs, no gallops Respiratory: CTA bilaterally, no wheezing, no rhonchi Extremities: Right arm with limited ROM, pain on palpation/winces. Skin: Right trochanter dressing c/d/i. Sacral pad dressing c/d/i.   Labs: Basic Metabolic Panel:  Recent Labs Lab 09/18/16 0443 09/19/16 1123 09/20/16 1126 09/21/16 0435 09/21/16 1144 09/21/16 1505  NA 141 161* 157*  --  155* 155*  K 4.6 3.5 3.3*  --  3.7 4.8  CL 96* 122* 121*  --  116* 118*  CO2 21* 25 31  --  31 27  GLUCOSE 147* 117* 243*  --  160* 139*  BUN 168* 90* 38*  --  19 19  CREATININE 14.62* 2.33* 0.86  --  0.77 0.79  CALCIUM 8.9 9.1 9.1  --  9.1 9.1  MG  --   --   --  1.8  --   --    Liver Function Tests:  Recent Labs Lab 09/17/16 2231 09/18/16 0443  AST 29 24  ALT 18 15*  ALKPHOS 51 45  BILITOT 1.1  1.4* 1.1  PROT 7.2 6.4*  ALBUMIN 3.1* 2.8*   CBC:  Recent Labs Lab 09/17/16 2231 09/18/16 0443 09/20/16 1126 09/21/16 0435  WBC 18.8* 18.9* 17.2* 18.3*  NEUTROABS 16.2*  --   --   --   HGB 11.4* 10.5* 9.6* 8.4*  HCT 32.8* 30.3* 29.8* 26.0*  MCV 65.6* 66.3* 71.0* 72.4*  PLT 167 159 200 174   Cardiac Enzymes:  Recent Labs Lab 09/17/16 2231 09/18/16 0443 09/20/16 1126  CKTOTAL 822* 712* 191    Time coordinating discharge: Approximately 40 minutes  Hazeline Junker, MD  [facility] 09/22/2016, 1:54 PM Pager [phone]

## 2016-09-29 DEATH — deceased

## 2017-07-02 IMAGING — CR DG CHEST 2V
3 series · 3 of 3 positions shown · non-contrast
Comparison: 03/22/2013

CLINICAL DATA: Cough.  Fall 1 week ago.  Diabetes.  Smoker.

EXAM:
CHEST  2 VIEW

[w chest lat]
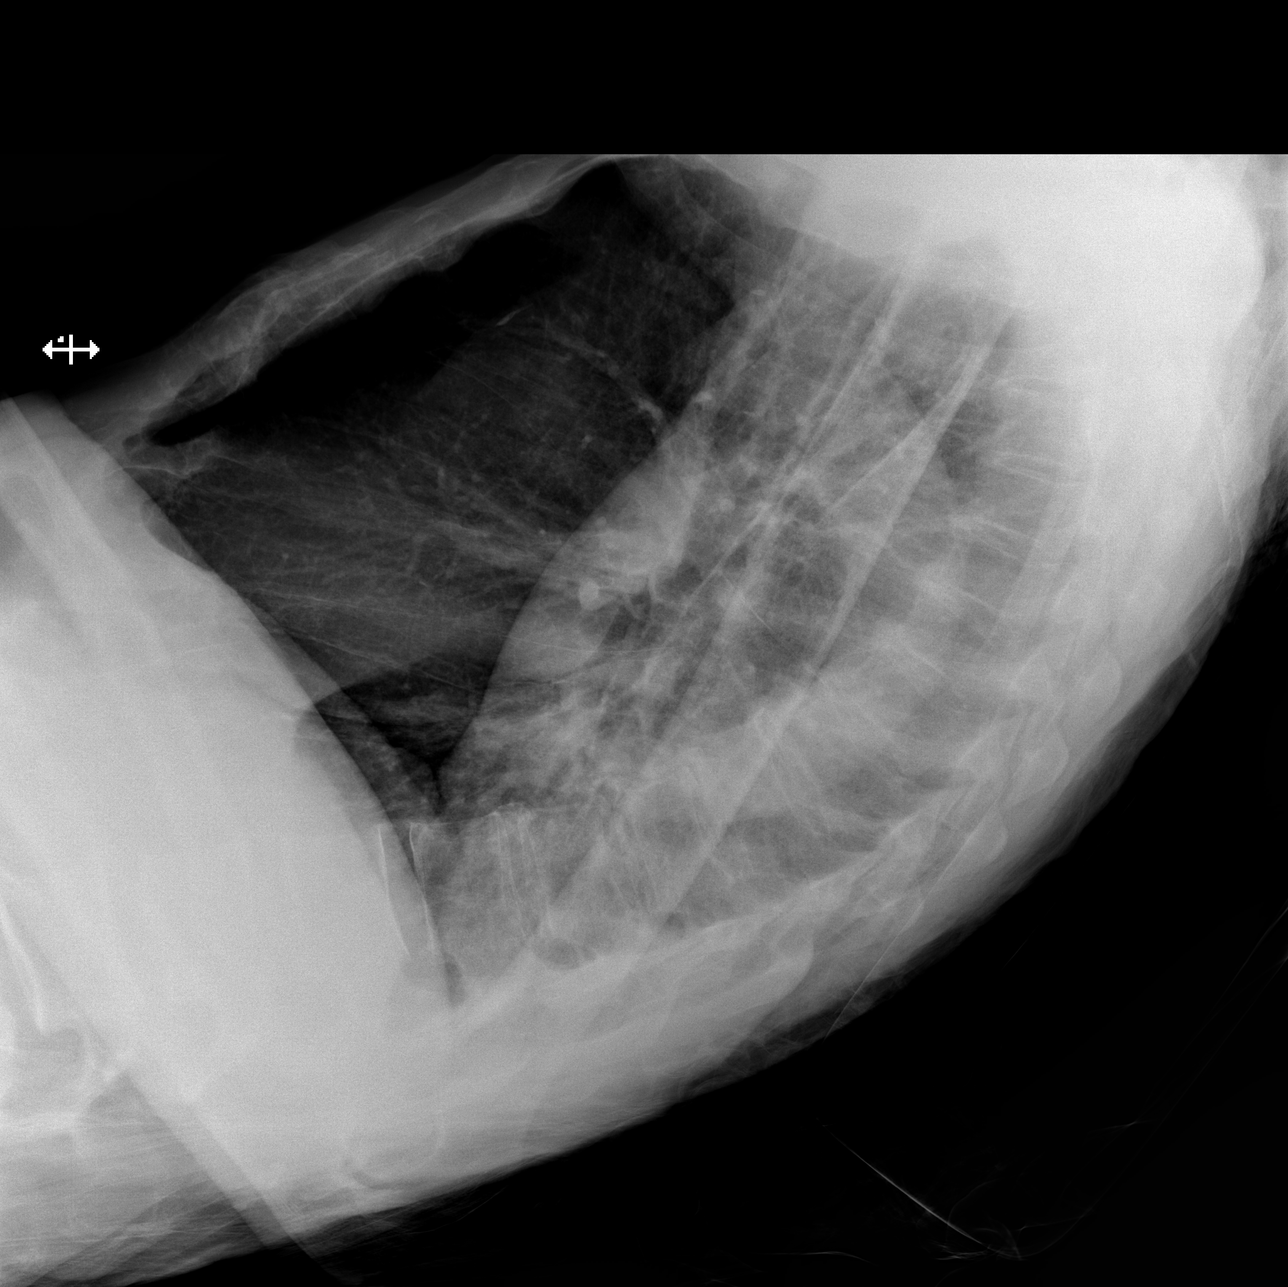

[x chest ap (1 of 2)]
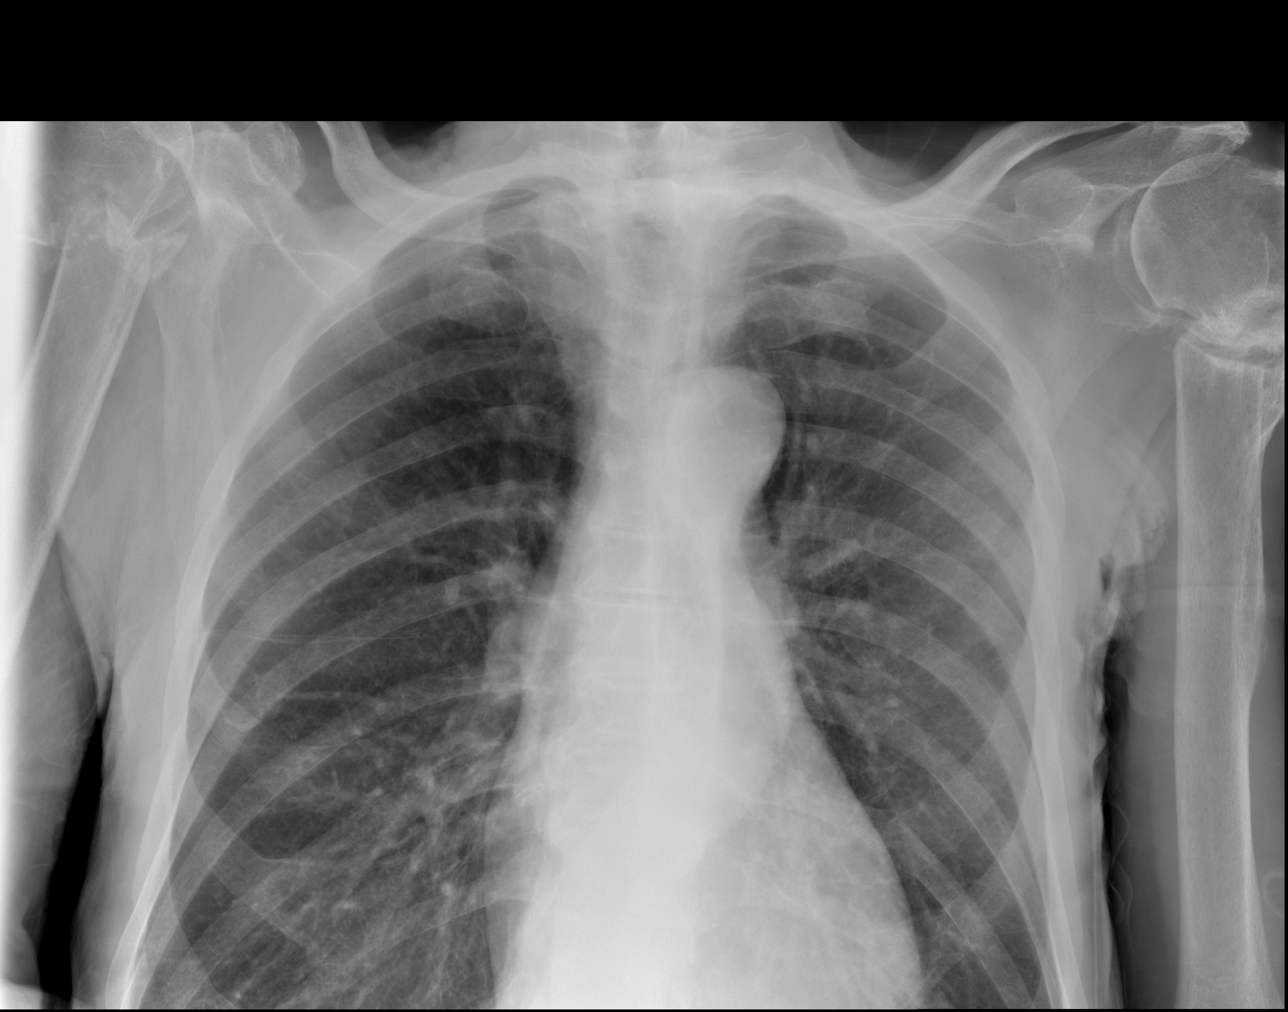

[x chest ap (2 of 2)]
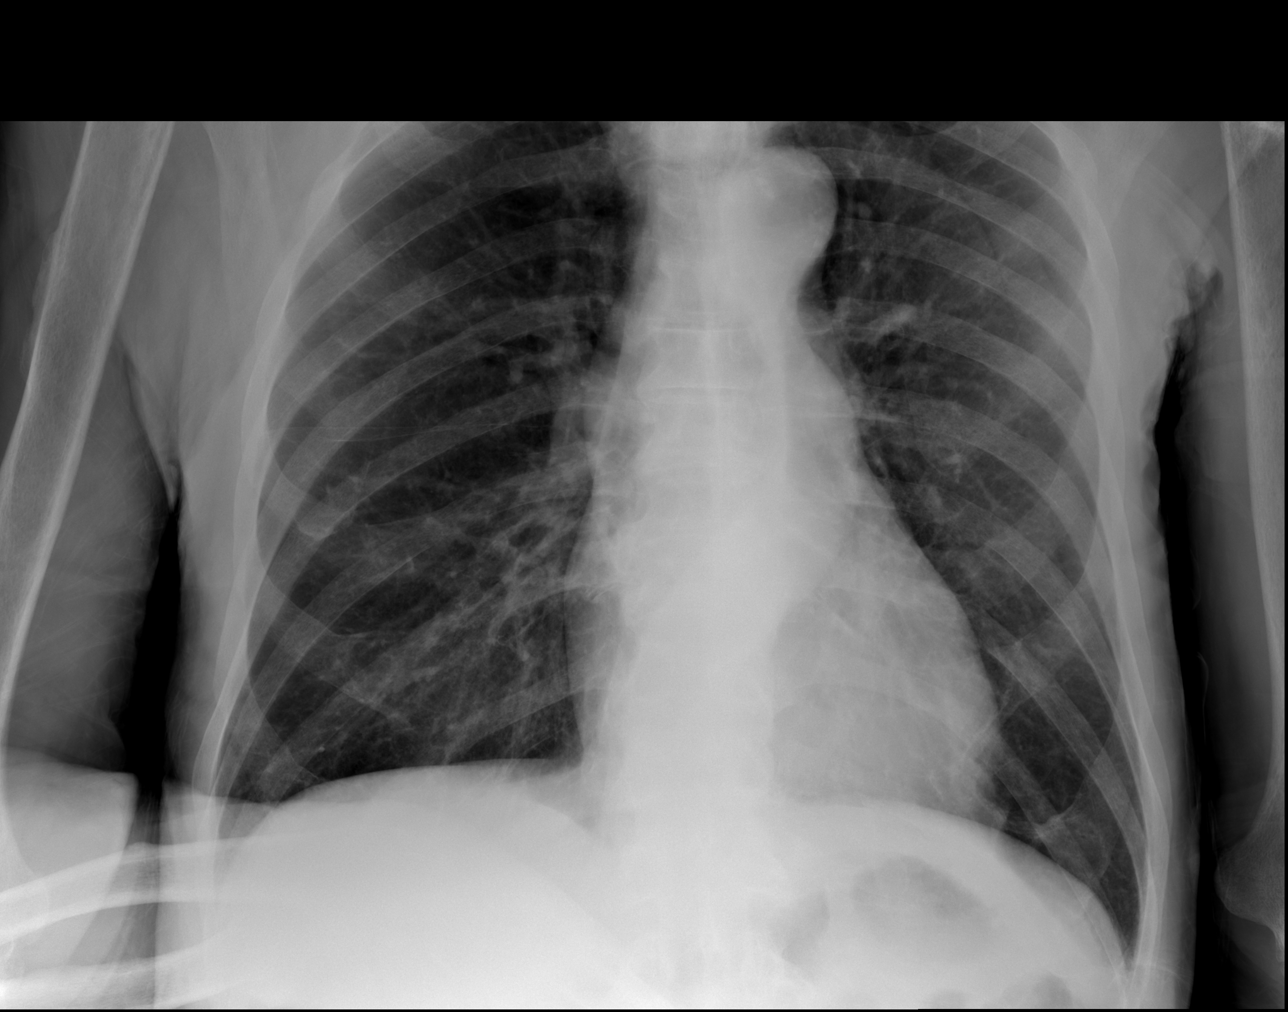

[3 of 3 positions shown; findings below may reference images not displayed]

FINDINGS: Two frontal and 1 lateral radiograph. The lateral view is suboptimal
secondary to positioning and resultant artifact.

Midline trachea. Normal heart size. Tortuous thoracic aorta. No
pleural effusion or pneumothorax. Hyperinflation. Clear lungs.
Proximal right humerus fracture. Probable remote proximal left
humerus fracture with nonunion.
IMPRESSION: Hyperinflation, without acute disease.

Bilateral proximal humerus fractures. Left-sided fracture is likely
remote.

## 2017-07-12 IMAGING — DX DG SHOULDER 2+V*R*
2 series · 2 of 2 positions shown · non-contrast
Comparison: 09/07/2016 right shoulder radiographs.

CLINICAL DATA: 82 y/o  M; right shoulder pain.

EXAM:
RIGHT SHOULDER - 2+ VIEW

[shoulder axial]
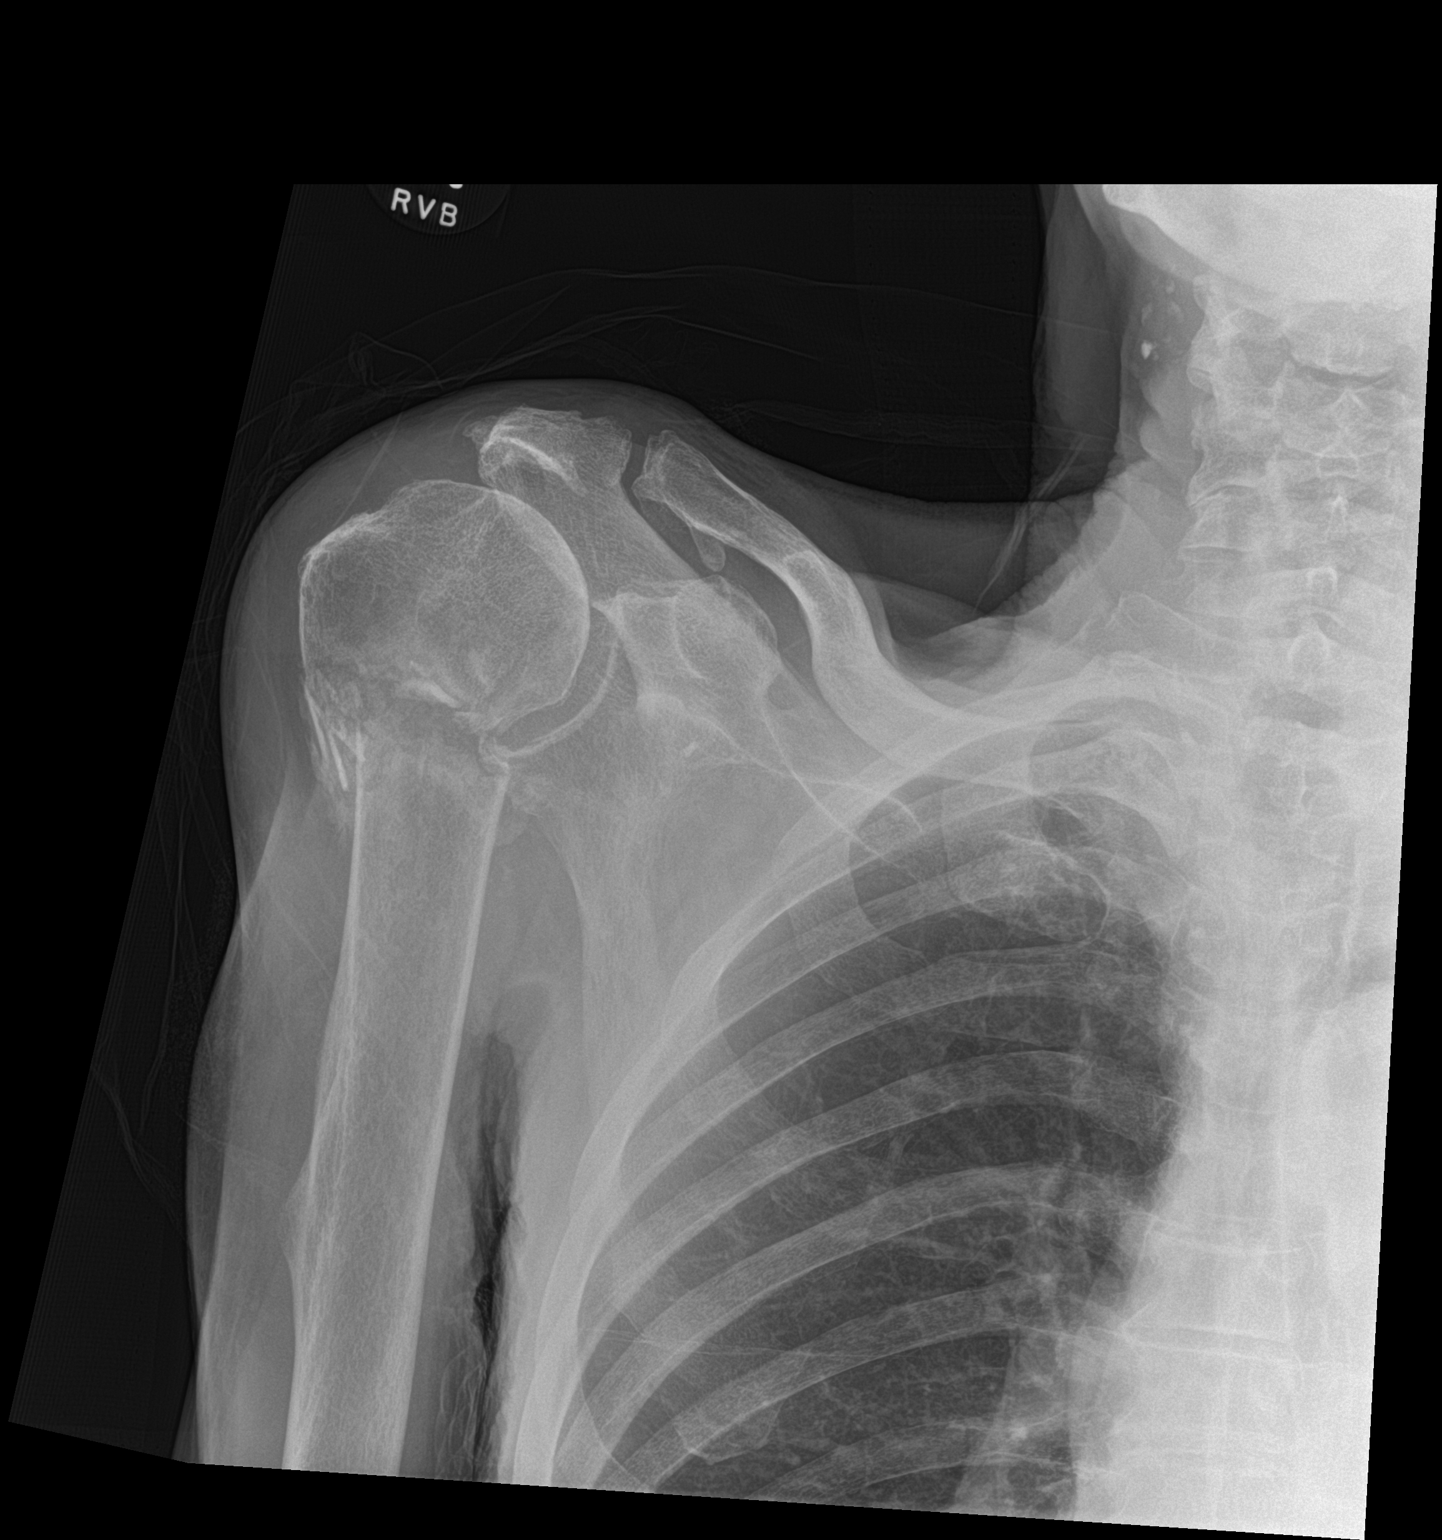

[shoulder obl]
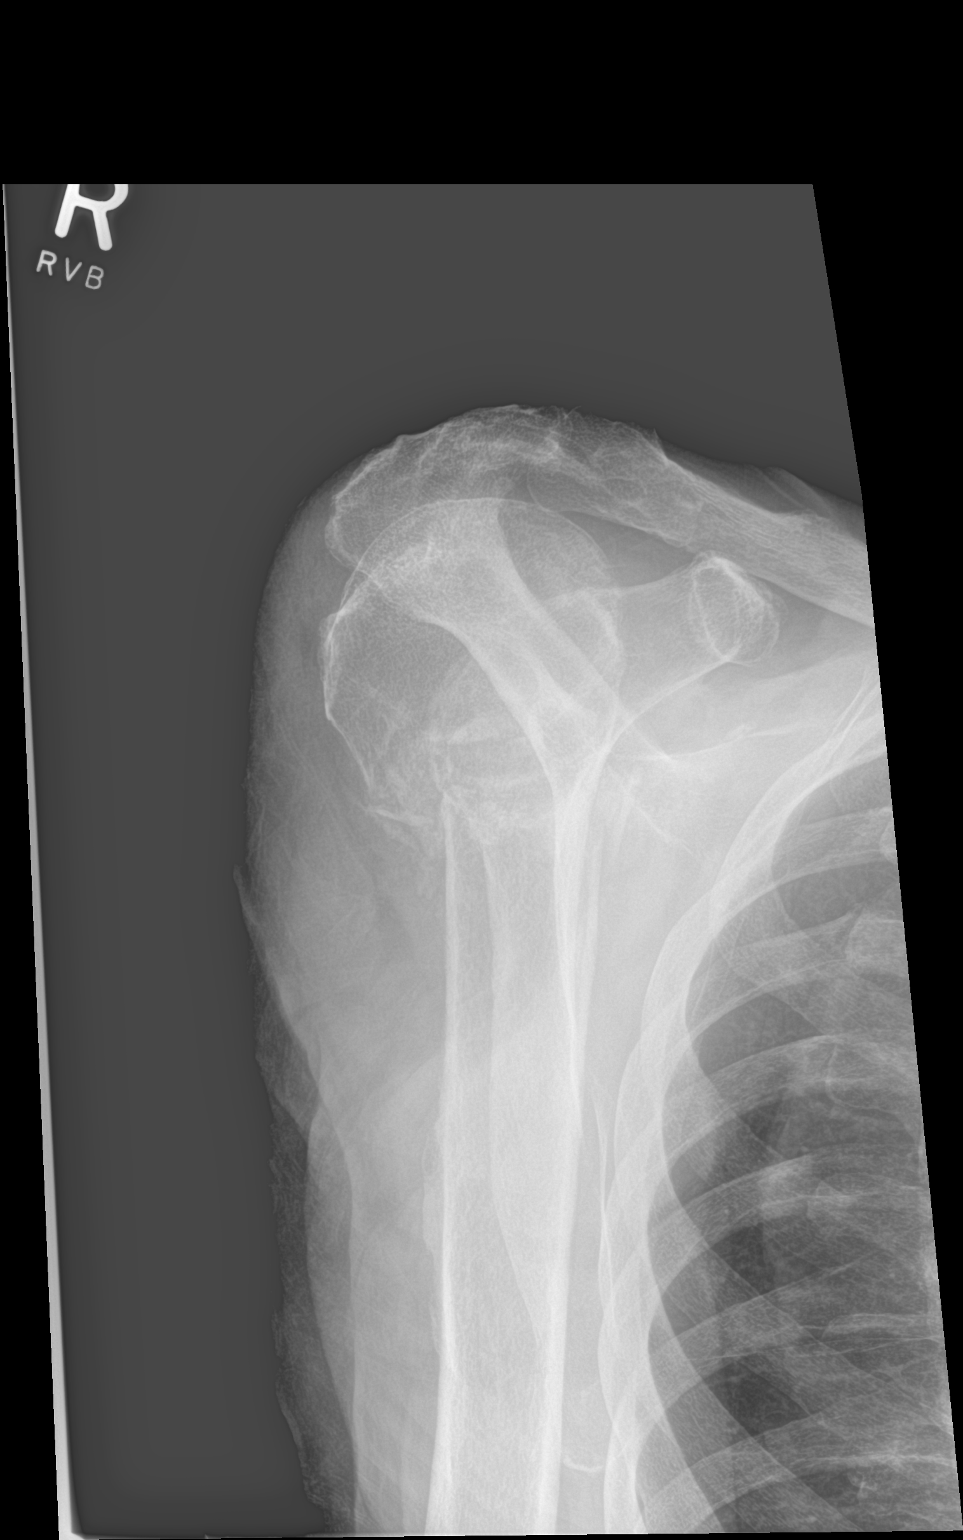

[2 of 2 positions shown; findings below may reference images not displayed]

FINDINGS: Mildly comminuted humeral neck fracture with approximately [DATE]
shaft's width medial displacement of the shaft relative to the head
unchanged from prior radiographs. No shoulder joint dislocation.
IMPRESSION: Mildly displaced and comminuted right humeral neck fracture grossly
unchanged from prior radiographs. No joint dislocation.

By: Yovana Bier M.D.
